# Patient Record
Sex: Female | Born: 1970 | Race: White | Hispanic: No | Marital: Single
Health system: Southern US, Community
[De-identification: ages and names within clinical notes are randomized; demographics above are authoritative.]

## PROBLEM LIST (undated history)

## (undated) DIAGNOSIS — D649 Anemia, unspecified: Secondary | ICD-10-CM

## (undated) DIAGNOSIS — Z8489 Family history of other specified conditions: Secondary | ICD-10-CM

## (undated) DIAGNOSIS — J45909 Unspecified asthma, uncomplicated: Secondary | ICD-10-CM

## (undated) DIAGNOSIS — S32810B Multiple fractures of pelvis with stable disruption of pelvic ring, initial encounter for open fracture: Secondary | ICD-10-CM

## (undated) DIAGNOSIS — I10 Essential (primary) hypertension: Secondary | ICD-10-CM

## (undated) DIAGNOSIS — K219 Gastro-esophageal reflux disease without esophagitis: Secondary | ICD-10-CM

## (undated) DIAGNOSIS — M160 Bilateral primary osteoarthritis of hip: Secondary | ICD-10-CM

## (undated) DIAGNOSIS — R0600 Dyspnea, unspecified: Secondary | ICD-10-CM

## (undated) DIAGNOSIS — F419 Anxiety disorder, unspecified: Secondary | ICD-10-CM

## (undated) DIAGNOSIS — R569 Unspecified convulsions: Secondary | ICD-10-CM

---

## 2010-01-13 ENCOUNTER — Encounter
Admission: RE | Admit: 2010-01-13 | Discharge: 2010-01-13 | Payer: Self-pay | Source: Home / Self Care | Admitting: Obstetrics and Gynecology

## 2010-11-24 ENCOUNTER — Encounter (INDEPENDENT_AMBULATORY_CARE_PROVIDER_SITE_OTHER): Payer: Self-pay | Admitting: Surgery

## 2010-11-24 DIAGNOSIS — J029 Acute pharyngitis, unspecified: Secondary | ICD-10-CM | POA: Insufficient documentation

## 2011-02-02 DIAGNOSIS — S32810B Multiple fractures of pelvis with stable disruption of pelvic ring, initial encounter for open fracture: Secondary | ICD-10-CM

## 2011-02-02 HISTORY — DX: Multiple fractures of pelvis with stable disruption of pelvic ring, initial encounter for open fracture: S32.810B

## 2012-09-01 HISTORY — PX: BREAST ENHANCEMENT SURGERY: SHX7

## 2012-10-17 ENCOUNTER — Ambulatory Visit: Payer: Self-pay | Admitting: Nurse Practitioner

## 2016-12-27 ENCOUNTER — Ambulatory Visit: Payer: PRIVATE HEALTH INSURANCE | Admitting: Neurology

## 2017-03-11 ENCOUNTER — Ambulatory Visit
Admission: RE | Admit: 2017-03-11 | Discharge: 2017-03-11 | Disposition: A | Discharge status: Home / Self Care | Payer: Self-pay | Source: Ambulatory Visit | Attending: Neurosurgery | Admitting: Neurosurgery

## 2017-03-11 ENCOUNTER — Other Ambulatory Visit: Payer: Self-pay | Admitting: Radiation Therapy

## 2017-03-11 DIAGNOSIS — C719 Malignant neoplasm of brain, unspecified: Secondary | ICD-10-CM

## 2017-03-11 DIAGNOSIS — D499 Neoplasm of unspecified behavior of unspecified site: Secondary | ICD-10-CM

## 2018-02-09 ENCOUNTER — Other Ambulatory Visit: Payer: Self-pay | Admitting: Neurosurgery

## 2018-02-09 DIAGNOSIS — D496 Neoplasm of unspecified behavior of brain: Secondary | ICD-10-CM

## 2018-02-18 ENCOUNTER — Ambulatory Visit
Admission: RE | Admit: 2018-02-18 | Discharge: 2018-02-18 | Disposition: A | Discharge status: Home / Self Care | Payer: Self-pay | Source: Ambulatory Visit | Attending: Neurosurgery | Admitting: Neurosurgery

## 2018-02-18 DIAGNOSIS — D496 Neoplasm of unspecified behavior of brain: Secondary | ICD-10-CM

## 2018-02-18 MED ORDER — GADOBENATE DIMEGLUMINE 529 MG/ML IV SOLN
15.0000 mL | Freq: Once | INTRAVENOUS | Status: AC | PRN
  Administered 2018-02-18: 15 mL via INTRAVENOUS

## 2018-02-24 ENCOUNTER — Other Ambulatory Visit: Payer: Self-pay | Admitting: Neurosurgery

## 2018-03-31 ENCOUNTER — Other Ambulatory Visit (HOSPITAL_COMMUNITY): Payer: Self-pay | Admitting: Neurosurgery

## 2018-03-31 DIAGNOSIS — D496 Neoplasm of unspecified behavior of brain: Secondary | ICD-10-CM

## 2018-04-06 ENCOUNTER — Other Ambulatory Visit (HOSPITAL_COMMUNITY): Payer: Self-pay

## 2018-04-07 ENCOUNTER — Other Ambulatory Visit (HOSPITAL_COMMUNITY): Payer: Self-pay

## 2018-04-11 NOTE — Pre-Procedure Instructions (Signed)
Alayiah Fontes [facility]  04/11/2018      [facility] [address] [place] [address] Phone: [phone] Fax: [phone]    Your procedure is scheduled on April 19, 2018.  Report to [facility] Entrance "A" at 630 AM.  Call this number if you have problems the morning of surgery:  [phone]   Remember:  Do not eat or drink after midnight.    Take these medicines the morning of surgery with A SIP OF WATER  Tylenol-if needed  7 days prior to surgery STOP taking any Aspirin (unless otherwise instructed by your surgeon), Aleve, Naproxen, Ibuprofen, Motrin, Advil, Goody's, BC's, all herbal medications, fish oil, and all vitamins     Do not wear jewelry, make-up or nail polish.  Do not wear lotions, powders, or perfumes, or deodorant.  Do not shave 48 hours prior to surgery.    Do not bring valuables to the hospital.  [facility] is not responsible for any belongings or valuables.  Contacts, dentures or bridgework may not be worn into surgery.  Leave your suitcase in the car.  After surgery it may be brought to your room.  For patients admitted to the hospital, discharge time will be determined by your treatment team.  Patients discharged the day of surgery will not be allowed to drive home.    [facility]- Preparing For Surgery  Before surgery, you can play an important role. Because skin is not sterile, your skin needs to be as free of germs as possible. You can reduce the number of germs on your skin by washing with CHG (chlorahexidine gluconate) Soap before surgery.  CHG is an antiseptic cleaner which kills germs and bonds with the skin to continue killing germs even after washing.    Oral Hygiene is also important to reduce your risk of infection.  Remember - BRUSH YOUR TEETH THE MORNING OF SURGERY WITH YOUR REGULAR TOOTHPASTE  Please do not use if you have an allergy to CHG or  antibacterial soaps. If your skin becomes reddened/irritated stop using the CHG.  Do not shave (including legs and underarms) for at least 48 hours prior to first CHG shower. It is OK to shave your face.  Please follow these instructions carefully.   1. Shower the NIGHT BEFORE SURGERY and the MORNING OF SURGERY with CHG.   2. If you chose to wash your hair, wash your hair first as usual with your normal shampoo.  3. After you shampoo, rinse your hair and body thoroughly to remove the shampoo.  4. Use CHG as you would any other liquid soap. You can apply CHG directly to the skin and wash gently with a scrungie or a clean washcloth.   5. Apply the CHG Soap to your body ONLY FROM THE NECK DOWN.  Do not use on open wounds or open sores. Avoid contact with your eyes, ears, mouth and genitals (private parts). Wash Face and genitals (private parts)  with your normal soap.  6. Wash thoroughly, paying special attention to the area where your surgery will be performed.  7. Thoroughly rinse your body with warm water from the neck down.  8. DO NOT shower/wash with your normal soap after using and rinsing off the CHG Soap.  9. Pat yourself dry with a CLEAN TOWEL.  10. Wear CLEAN PAJAMAS to bed the night before surgery,  wear comfortable clothes the morning of surgery  11. Place CLEAN SHEETS on your bed the night of your first shower and DO NOT SLEEP WITH PETS.  Day of Surgery:  Do not apply any deodorants/lotions.  Please wear clean clothes to the hospital/surgery center.   Remember to brush your teeth WITH YOUR REGULAR TOOTHPASTE.   Please read over the following fact sheets that you were given.

## 2018-04-12 ENCOUNTER — Other Ambulatory Visit: Payer: Self-pay

## 2018-04-12 ENCOUNTER — Encounter (HOSPITAL_COMMUNITY)
Admission: RE | Admit: 2018-04-12 | Discharge: 2018-04-12 | Disposition: A | Discharge status: Home / Self Care | Payer: BLUE CROSS/BLUE SHIELD | Source: Ambulatory Visit | Attending: Neurosurgery | Admitting: Neurosurgery

## 2018-04-12 ENCOUNTER — Encounter (HOSPITAL_COMMUNITY): Payer: Self-pay

## 2018-04-12 ENCOUNTER — Ambulatory Visit (HOSPITAL_COMMUNITY)
Admission: RE | Admit: 2018-04-12 | Discharge: 2018-04-12 | Disposition: A | Discharge status: Home / Self Care | Payer: BLUE CROSS/BLUE SHIELD | Source: Ambulatory Visit | Attending: Neurosurgery | Admitting: Neurosurgery

## 2018-04-12 DIAGNOSIS — D496 Neoplasm of unspecified behavior of brain: Secondary | ICD-10-CM | POA: Insufficient documentation

## 2018-04-12 HISTORY — DX: Multiple fractures of pelvis with stable disruption of pelvic ring, initial encounter for open fracture: S32.810B

## 2018-04-12 HISTORY — DX: Anxiety disorder, unspecified: F41.9

## 2018-04-12 HISTORY — DX: Unspecified asthma, uncomplicated: J45.909

## 2018-04-12 HISTORY — DX: Family history of other specified conditions: Z84.89

## 2018-04-12 HISTORY — DX: Bilateral primary osteoarthritis of hip: M16.0

## 2018-04-12 HISTORY — DX: Anemia, unspecified: D64.9

## 2018-04-12 LAB — BASIC METABOLIC PANEL
Anion gap: 9 (ref 5–15)
BUN: 8 mg/dL (ref 6–20)
CO2: 23 mmol/L (ref 22–32)
CREATININE: 0.63 mg/dL (ref 0.44–1.00)
Calcium: 9.2 mg/dL (ref 8.9–10.3)
Chloride: 106 mmol/L (ref 98–111)
GFR calc non Af Amer: >60 mL/min (ref >60)
GLUCOSE: 102 mg/dL — AB (ref 70–99)
Potassium: 3.7 mmol/L (ref 3.5–5.1)
Sodium: 138 mmol/L (ref 135–145)

## 2018-04-12 LAB — TYPE AND SCREEN
ABO/RH(D): A NEG
Antibody Screen: NEGATIVE

## 2018-04-12 LAB — CBC
HCT: 38.6 % (ref 36.0–46.0)
Hemoglobin: 12.3 g/dL (ref 12.0–15.0)
MCH: 29.6 pg (ref 26.0–34.0)
MCHC: 31.9 g/dL (ref 30.0–36.0)
MCV: 92.8 fL (ref 80.0–100.0)
NRBC: 0 % (ref 0.0–0.2)
PLATELETS: 342 10*3/uL (ref 150–400)
RBC: 4.16 MIL/uL (ref 3.87–5.11)
RDW: 12.3 % (ref 11.5–15.5)
WBC: 6.8 10*3/uL (ref 4.0–10.5)

## 2018-04-12 LAB — ABO/RH: ABO/RH(D): A NEG

## 2018-04-12 NOTE — Progress Notes (Signed)
PCP: [facility]--currently in transition between providers, unable to remember name Cardiologist: denies  EKG: n/a CXR: n/a ECHO: denies Stress Test: denies Cardiac Cath: denies  For CT scan after PAT appt today   Patient denies shortness of breath, fever, cough, and chest pain at PAT appointment.  Patient verbalized understanding of instructions provided today at the PAT appointment.  Patient asked to review instructions at home and day of surgery.

## 2018-04-14 NOTE — Anesthesia Preprocedure Evaluation (Addendum)
Anesthesia Evaluation  Patient identified by MRN, date of birth, ID band Patient awake    Reviewed: Allergy & Precautions, NPO status , Patient's Chart, lab work & pertinent test results  Airway Mallampati: II  TM Distance: >3 FB Neck ROM: Full    Dental  (+) Teeth Intact, Dental Advisory Given   Pulmonary    breath sounds clear to auscultation       Cardiovascular  Rhythm:Regular Rate:Normal     Neuro/Psych    GI/Hepatic   Endo/Other    Renal/GU      Musculoskeletal   Abdominal   Peds  Hematology   Anesthesia Other Findings   Reproductive/Obstetrics                             Anesthesia Physical Anesthesia Plan  ASA: III  Anesthesia Plan: MAC   Post-op Pain Management:    Induction: Intravenous  PONV Risk Score and Plan: Ondansetron and Dexamethasone  Airway Management Planned: Simple Face Mask and Natural Airway  Additional Equipment:   Intra-op Plan:   Post-operative Plan:   Informed Consent: I have reviewed the patients History and Physical, chart, labs and discussed the procedure including the risks, benefits and alternatives for the proposed anesthesia with the patient or authorized representative who has indicated his/her understanding and acceptance.     Dental advisory given  Plan Discussed with: CRNA and Anesthesiologist  Anesthesia Plan Comments: (Arterial line in left radial and PIV in right side please.  Anesthesia will have right side of patient per Dr Sherwood Gambler.  Henderson Cloud, CRNA  )      Anesthesia Quick Evaluation

## 2018-04-17 ENCOUNTER — Other Ambulatory Visit (HOSPITAL_COMMUNITY): Payer: Self-pay

## 2018-04-18 ENCOUNTER — Other Ambulatory Visit: Payer: Self-pay | Admitting: Neurosurgery

## 2018-04-18 NOTE — Progress Notes (Signed)
Pt contacted, pt denies domestic or international travel in the last 2 weeks. Pt denies cold, cough, flu or fever. Pt reports support person accompanying them to surgery is in good health and has not had any new symptoms of illness.   Jacqlyn Larsen, RN

## 2018-04-19 ENCOUNTER — Inpatient Hospital Stay (HOSPITAL_COMMUNITY)
Admission: RE | Admit: 2018-04-19 | Discharge: 2018-04-21 | DRG: 027 | Disposition: A | Discharge status: Home / Self Care | Payer: BLUE CROSS/BLUE SHIELD | Attending: Neurosurgery | Admitting: Neurosurgery

## 2018-04-19 ENCOUNTER — Inpatient Hospital Stay (HOSPITAL_COMMUNITY): Payer: BLUE CROSS/BLUE SHIELD | Admitting: Certified Registered Nurse Anesthetist

## 2018-04-19 ENCOUNTER — Encounter (HOSPITAL_COMMUNITY)
Admission: RE | Disposition: A | Discharge status: Home / Self Care | Payer: Self-pay | Source: Home / Self Care | Attending: Neurosurgery

## 2018-04-19 ENCOUNTER — Encounter (HOSPITAL_COMMUNITY): Payer: Self-pay | Admitting: *Deleted

## 2018-04-19 ENCOUNTER — Other Ambulatory Visit: Payer: Self-pay

## 2018-04-19 DIAGNOSIS — Y92230 Patient room in hospital as the place of occurrence of the external cause: Secondary | ICD-10-CM | POA: Diagnosis not present

## 2018-04-19 DIAGNOSIS — F419 Anxiety disorder, unspecified: Secondary | ICD-10-CM | POA: Diagnosis present

## 2018-04-19 DIAGNOSIS — T402X5A Adverse effect of other opioids, initial encounter: Secondary | ICD-10-CM | POA: Diagnosis not present

## 2018-04-19 DIAGNOSIS — C719 Malignant neoplasm of brain, unspecified: Secondary | ICD-10-CM | POA: Diagnosis present

## 2018-04-19 DIAGNOSIS — Z79899 Other long term (current) drug therapy: Secondary | ICD-10-CM

## 2018-04-19 DIAGNOSIS — Z793 Long term (current) use of hormonal contraceptives: Secondary | ICD-10-CM | POA: Diagnosis not present

## 2018-04-19 DIAGNOSIS — M1610 Unilateral primary osteoarthritis, unspecified hip: Secondary | ICD-10-CM | POA: Diagnosis present

## 2018-04-19 DIAGNOSIS — D496 Neoplasm of unspecified behavior of brain: Secondary | ICD-10-CM | POA: Diagnosis present

## 2018-04-19 HISTORY — PX: CRANIOTOMY: SHX93

## 2018-04-19 HISTORY — PX: APPLICATION OF CRANIAL NAVIGATION: SHX6578

## 2018-04-19 LAB — POCT PREGNANCY, URINE: Preg Test, Ur: NEGATIVE

## 2018-04-19 SURGERY — CRANIOTOMY TUMOR EXCISION
Anesthesia: Monitor Anesthesia Care | Site: Head | Laterality: Left

## 2018-04-19 MED ORDER — DEXAMETHASONE SODIUM PHOSPHATE 10 MG/ML IJ SOLN
INTRAMUSCULAR | Status: DC | PRN
  Administered 2018-04-19: 10 mg via INTRAVENOUS

## 2018-04-19 MED ORDER — DEXAMETHASONE SODIUM PHOSPHATE 4 MG/ML IJ SOLN
4.0000 mg | Freq: Four times a day (QID) | INTRAMUSCULAR | Status: AC
  Administered 2018-04-20 – 2018-04-21 (×4): 4 mg via INTRAVENOUS
  Filled 2018-04-19 (×4): qty 1

## 2018-04-19 MED ORDER — SODIUM CHLORIDE 0.9 % IV SOLN
INTRAVENOUS | Status: DC | PRN
  Administered 2018-04-19 (×2): via INTRAVENOUS

## 2018-04-19 MED ORDER — HYDROXYZINE HCL 50 MG PO TABS
50.0000 mg | ORAL_TABLET | ORAL | Status: DC | PRN
  Filled 2018-04-19: qty 1

## 2018-04-19 MED ORDER — NORGESTIM-ETH ESTRAD TRIPHASIC 0.18/0.215/0.25 MG-35 MCG PO TABS: 1.0000 | ORAL_TABLET | Freq: Every evening | ORAL | Status: DC

## 2018-04-19 MED ORDER — ACETAMINOPHEN 10 MG/ML IV SOLN: 1000.0000 mg | Freq: Once | INTRAVENOUS | Status: DC | PRN

## 2018-04-19 MED ORDER — METHYLENE BLUE 0.5 % INJ SOLN
INTRAVENOUS | Status: AC
  Filled 2018-04-19: qty 10

## 2018-04-19 MED ORDER — THROMBIN 5000 UNITS EX SOLR
CUTANEOUS | Status: AC
  Filled 2018-04-19: qty 5000

## 2018-04-19 MED ORDER — ONDANSETRON HCL 4 MG PO TABS: 4.0000 mg | ORAL_TABLET | ORAL | Status: DC | PRN

## 2018-04-19 MED ORDER — KETAMINE HCL 50 MG/5ML IJ SOSY
PREFILLED_SYRINGE | INTRAMUSCULAR | Status: AC
  Filled 2018-04-19: qty 5

## 2018-04-19 MED ORDER — MAGNESIUM HYDROXIDE 400 MG/5ML PO SUSP: 30.0000 mL | Freq: Every day | ORAL | Status: DC | PRN

## 2018-04-19 MED ORDER — SODIUM CHLORIDE 0.9 % IV SOLN
INTRAVENOUS | Status: DC | PRN
  Administered 2018-04-19: .05 ug/kg/min via INTRAVENOUS

## 2018-04-19 MED ORDER — CEFAZOLIN SODIUM-DEXTROSE 2-4 GM/100ML-% IV SOLN
INTRAVENOUS | Status: AC
  Filled 2018-04-19: qty 100

## 2018-04-19 MED ORDER — KETAMINE HCL 10 MG/ML IJ SOLN
INTRAMUSCULAR | Status: DC | PRN
  Administered 2018-04-19: 5 mg via INTRAVENOUS

## 2018-04-19 MED ORDER — BACITRACIN ZINC 500 UNIT/GM EX OINT
TOPICAL_OINTMENT | CUTANEOUS | Status: AC
  Filled 2018-04-19: qty 28.35

## 2018-04-19 MED ORDER — ONDANSETRON HCL 4 MG/2ML IJ SOLN: 4.0000 mg | Freq: Once | INTRAMUSCULAR | Status: DC | PRN

## 2018-04-19 MED ORDER — PROPOFOL 10 MG/ML IV BOLUS
INTRAVENOUS | Status: AC
  Filled 2018-04-19: qty 20

## 2018-04-19 MED ORDER — LIDOCAINE-EPINEPHRINE 1 %-1:100000 IJ SOLN
INTRAMUSCULAR | Status: AC
  Filled 2018-04-19: qty 3

## 2018-04-19 MED ORDER — LIDOCAINE HCL 1 % IJ SOLN
INTRAMUSCULAR | Status: DC | PRN
  Administered 2018-04-19: .4 mL

## 2018-04-19 MED ORDER — DEXMEDETOMIDINE HCL IN NACL 200 MCG/50ML IV SOLN
INTRAVENOUS | Status: AC
  Filled 2018-04-19: qty 50

## 2018-04-19 MED ORDER — BUPIVACAINE HCL (PF) 0.5 % IJ SOLN
INTRAMUSCULAR | Status: AC
  Filled 2018-04-19: qty 90

## 2018-04-19 MED ORDER — PANTOPRAZOLE SODIUM 40 MG IV SOLR
40.0000 mg | Freq: Every day | INTRAVENOUS | Status: DC
  Administered 2018-04-19 – 2018-04-20 (×2): 40 mg via INTRAVENOUS
  Filled 2018-04-19 (×2): qty 40

## 2018-04-19 MED ORDER — CEFAZOLIN SODIUM-DEXTROSE 2-4 GM/100ML-% IV SOLN
2.0000 g | INTRAVENOUS | Status: AC
  Administered 2018-04-19: 2 g via INTRAVENOUS

## 2018-04-19 MED ORDER — FLEET ENEMA 7-19 GM/118ML RE ENEM: 1.0000 | ENEMA | Freq: Once | RECTAL | Status: DC | PRN

## 2018-04-19 MED ORDER — CHLORHEXIDINE GLUCONATE CLOTH 2 % EX PADS: 6.0000 | MEDICATED_PAD | Freq: Once | CUTANEOUS | Status: DC

## 2018-04-19 MED ORDER — LIDOCAINE HCL (PF) 1 % IJ SOLN
INTRAMUSCULAR | Status: AC
  Filled 2018-04-19: qty 60

## 2018-04-19 MED ORDER — HEMOSTATIC AGENTS (NO CHARGE) OPTIME
TOPICAL | Status: DC | PRN
  Administered 2018-04-19: 1 via TOPICAL

## 2018-04-19 MED ORDER — POTASSIUM CHLORIDE IN NACL 40-0.9 MEQ/L-% IV SOLN
INTRAVENOUS | Status: DC
  Administered 2018-04-19 – 2018-04-20 (×2): 100 mL/h via INTRAVENOUS
  Filled 2018-04-19 (×2): qty 1000

## 2018-04-19 MED ORDER — ACETAMINOPHEN 10 MG/ML IV SOLN
INTRAVENOUS | Status: DC | PRN
  Administered 2018-04-19: 1000 mg via INTRAVENOUS

## 2018-04-19 MED ORDER — SODIUM CHLORIDE 0.9 % IV SOLN
INTRAVENOUS | Status: DC | PRN
  Administered 2018-04-19: 500 mL

## 2018-04-19 MED ORDER — ONDANSETRON HCL 4 MG/2ML IJ SOLN
4.0000 mg | INTRAMUSCULAR | Status: DC | PRN
  Administered 2018-04-19: 8 mg via INTRAVENOUS
  Filled 2018-04-19: qty 4

## 2018-04-19 MED ORDER — FAMOTIDINE IN NACL 20-0.9 MG/50ML-% IV SOLN
20.0000 mg | INTRAVENOUS | Status: AC
  Administered 2018-04-19: 20 mg via INTRAVENOUS
  Filled 2018-04-19: qty 50

## 2018-04-19 MED ORDER — HYDROXYZINE HCL 50 MG/ML IM SOLN
50.0000 mg | INTRAMUSCULAR | Status: DC | PRN
  Filled 2018-04-19: qty 1

## 2018-04-19 MED ORDER — DEXMEDETOMIDINE HCL IN NACL 400 MCG/100ML IV SOLN
INTRAVENOUS | Status: DC | PRN
  Administered 2018-04-19: 0.7 ug/kg/h via INTRAVENOUS

## 2018-04-19 MED ORDER — HYDROCODONE-ACETAMINOPHEN 5-325 MG PO TABS
1.0000 | ORAL_TABLET | ORAL | Status: DC | PRN
  Administered 2018-04-19 (×2): 2 via ORAL
  Administered 2018-04-20: 1 via ORAL
  Administered 2018-04-20 – 2018-04-21 (×3): 2 via ORAL
  Filled 2018-04-19: qty 1
  Filled 2018-04-19 (×5): qty 2

## 2018-04-19 MED ORDER — MORPHINE SULFATE (PF) 4 MG/ML IV SOLN
4.0000 mg | INTRAVENOUS | Status: DC | PRN
  Administered 2018-04-19: 4 mg via INTRAMUSCULAR
  Filled 2018-04-19: qty 1

## 2018-04-19 MED ORDER — 0.9 % SODIUM CHLORIDE (POUR BTL) OPTIME
TOPICAL | Status: DC | PRN
  Administered 2018-04-19 (×4): 1000 mL

## 2018-04-19 MED ORDER — PROPOFOL 10 MG/ML IV BOLUS
INTRAVENOUS | Status: DC | PRN
  Administered 2018-04-19: 40 mg via INTRAVENOUS

## 2018-04-19 MED ORDER — THROMBIN 20000 UNITS EX SOLR
CUTANEOUS | Status: DC | PRN
  Administered 2018-04-19: 20 mL via TOPICAL

## 2018-04-19 MED ORDER — PHENYLEPHRINE 40 MCG/ML (10ML) SYRINGE FOR IV PUSH (FOR BLOOD PRESSURE SUPPORT)
PREFILLED_SYRINGE | INTRAVENOUS | Status: AC
  Filled 2018-04-19: qty 10

## 2018-04-19 MED ORDER — MIDAZOLAM HCL 5 MG/5ML IJ SOLN
INTRAMUSCULAR | Status: DC | PRN
  Administered 2018-04-19 (×4): 1 mg via INTRAVENOUS

## 2018-04-19 MED ORDER — LIDOCAINE-EPINEPHRINE 1 %-1:100000 IJ SOLN
INTRAMUSCULAR | Status: DC | PRN
  Administered 2018-04-19: 20 mL

## 2018-04-19 MED ORDER — FENTANYL CITRATE (PF) 100 MCG/2ML IJ SOLN
INTRAMUSCULAR | Status: DC | PRN
  Administered 2018-04-19 (×2): 50 ug via INTRAVENOUS

## 2018-04-19 MED ORDER — LEVETIRACETAM IN NACL 500 MG/100ML IV SOLN
500.0000 mg | Freq: Two times a day (BID) | INTRAVENOUS | Status: DC
  Administered 2018-04-19 – 2018-04-21 (×4): 500 mg via INTRAVENOUS
  Filled 2018-04-19 (×5): qty 100

## 2018-04-19 MED ORDER — DEXAMETHASONE SODIUM PHOSPHATE 4 MG/ML IJ SOLN
4.0000 mg | Freq: Three times a day (TID) | INTRAMUSCULAR | Status: DC
  Administered 2018-04-21: 4 mg via INTRAVENOUS
  Filled 2018-04-19: qty 1

## 2018-04-19 MED ORDER — BACITRACIN ZINC 500 UNIT/GM EX OINT
TOPICAL_OINTMENT | CUTANEOUS | Status: DC | PRN
  Administered 2018-04-19: 1 via TOPICAL

## 2018-04-19 MED ORDER — THROMBIN 5000 UNITS EX SOLR
OROMUCOSAL | Status: DC | PRN
  Administered 2018-04-19: 5 mL via TOPICAL

## 2018-04-19 MED ORDER — MANNITOL 25 % IV SOLN
INTRAVENOUS | Status: DC | PRN
  Administered 2018-04-19 (×4): 12.5 g via INTRAVENOUS

## 2018-04-19 MED ORDER — DEXMEDETOMIDINE HCL IN NACL 400 MCG/100ML IV SOLN
0.4000 ug/kg/h | INTRAVENOUS | Status: DC
  Filled 2018-04-19: qty 100

## 2018-04-19 MED ORDER — DEXMEDETOMIDINE HCL 200 MCG/2ML IV SOLN
INTRAVENOUS | Status: DC | PRN
  Administered 2018-04-19: 40 ug via INTRAVENOUS
  Administered 2018-04-19: 20 ug via INTRAVENOUS
  Administered 2018-04-19: 12 ug via INTRAVENOUS
  Administered 2018-04-19: 20 ug via INTRAVENOUS

## 2018-04-19 MED ORDER — REMIFENTANIL HCL 2 MG IV SOLR: INTRAVENOUS | Status: DC | PRN

## 2018-04-19 MED ORDER — SODIUM CHLORIDE 0.9 % IV SOLN
750.0000 mg | INTRAVENOUS | Status: AC
  Administered 2018-04-19: 750 mg via INTRAVENOUS
  Filled 2018-04-19: qty 7.5

## 2018-04-19 MED ORDER — HYDRALAZINE HCL 20 MG/ML IJ SOLN: 5.0000 mg | INTRAMUSCULAR | Status: DC | PRN

## 2018-04-19 MED ORDER — METHYLENE BLUE 0.5 % INJ SOLN
INTRAVENOUS | Status: DC | PRN
  Administered 2018-04-19: 5 mL via SUBMUCOSAL

## 2018-04-19 MED ORDER — LABETALOL HCL 5 MG/ML IV SOLN: 5.0000 mg | INTRAVENOUS | Status: DC | PRN

## 2018-04-19 MED ORDER — SODIUM CHLORIDE 0.9 % IV SOLN
0.0500 ug/kg/min | INTRAVENOUS | Status: DC
  Filled 2018-04-19: qty 5000

## 2018-04-19 MED ORDER — MIDAZOLAM HCL 2 MG/2ML IJ SOLN
INTRAMUSCULAR | Status: AC
  Filled 2018-04-19: qty 2

## 2018-04-19 MED ORDER — DEXAMETHASONE SODIUM PHOSPHATE 10 MG/ML IJ SOLN
6.0000 mg | Freq: Four times a day (QID) | INTRAMUSCULAR | Status: AC
  Administered 2018-04-19 – 2018-04-20 (×4): 6 mg via INTRAVENOUS
  Filled 2018-04-19 (×3): qty 1

## 2018-04-19 MED ORDER — FENTANYL CITRATE (PF) 250 MCG/5ML IJ SOLN
INTRAMUSCULAR | Status: AC
  Filled 2018-04-19: qty 5

## 2018-04-19 MED ORDER — THROMBIN 20000 UNITS EX SOLR
CUTANEOUS | Status: AC
  Filled 2018-04-19: qty 20000

## 2018-04-19 MED ORDER — FENTANYL CITRATE (PF) 100 MCG/2ML IJ SOLN: 25.0000 ug | INTRAMUSCULAR | Status: DC | PRN

## 2018-04-19 MED ORDER — BUPIVACAINE HCL (PF) 0.5 % IJ SOLN
INTRAMUSCULAR | Status: DC | PRN
  Administered 2018-04-19: 20 mL

## 2018-04-19 MED ORDER — BISACODYL 10 MG RE SUPP: 10.0000 mg | Freq: Every day | RECTAL | Status: DC | PRN

## 2018-04-19 MED ORDER — ACETAMINOPHEN 10 MG/ML IV SOLN
INTRAVENOUS | Status: AC
  Filled 2018-04-19: qty 100

## 2018-04-19 MED ORDER — LIDOCAINE 2% (20 MG/ML) 5 ML SYRINGE
INTRAMUSCULAR | Status: AC
  Filled 2018-04-19: qty 5

## 2018-04-19 MED ORDER — ONDANSETRON HCL 4 MG/2ML IJ SOLN
INTRAMUSCULAR | Status: DC | PRN
  Administered 2018-04-19: 4 mg via INTRAVENOUS

## 2018-04-19 SURGICAL SUPPLY — 85 items
APPLICATOR COTTON TIP 6 STRL (MISCELLANEOUS) ×1 IMPLANT
APPLICATOR COTTON TIP 6IN STRL (MISCELLANEOUS) ×2
BAG DECANTER FOR FLEXI CONT (MISCELLANEOUS) ×2 IMPLANT
BIT DRILL WIRE PASS 1.3MM (BIT) IMPLANT
BLADE CLIPPER SURG (BLADE) ×2 IMPLANT
BLADE ULTRA TIP 2M (BLADE) IMPLANT
BNDG STRETCH 4X75 STRL LF (GAUZE/BANDAGES/DRESSINGS) IMPLANT
BUR ACORN 6.0 PRECISION (BURR) ×2 IMPLANT
BUR MATCHSTICK NEURO 3.0 LAGG (BURR) IMPLANT
BUR SPIRAL ROUTER 2.3 (BUR) ×2 IMPLANT
CANISTER SUCT 3000ML PPV (MISCELLANEOUS) ×4 IMPLANT
CARTRIDGE OIL MAESTRO DRILL (MISCELLANEOUS) ×1 IMPLANT
CLIP VESOCCLUDE MED 6/CT (CLIP) IMPLANT
CONT SPEC 4OZ CLIKSEAL STRL BL (MISCELLANEOUS) ×6 IMPLANT
COTTONBALL LRG STERILE PKG (GAUZE/BANDAGES/DRESSINGS) IMPLANT
COVER WAND RF STERILE (DRAPES) ×2 IMPLANT
DIFFUSER DRILL AIR PNEUMATIC (MISCELLANEOUS) ×2 IMPLANT
DISP BIPOLAR PROBE CORTI STIMU (MISCELLANEOUS) ×1
DRAIN SUBARACHNOID (WOUND CARE) IMPLANT
DRAPE MICROSCOPE LEICA (MISCELLANEOUS) ×2 IMPLANT
DRAPE NEUROLOGICAL W/INCISE (DRAPES) ×2 IMPLANT
DRAPE STERI IOBAN 125X83 (DRAPES) IMPLANT
DRAPE SURG 17X23 STRL (DRAPES) IMPLANT
DRAPE WARM FLUID 44X44 (DRAPE) ×2 IMPLANT
DRILL WIRE PASS 1.3MM (BIT)
DRSG ADAPTIC 3X8 NADH LF (GAUZE/BANDAGES/DRESSINGS) ×2 IMPLANT
ELECT REM PT RETURN 9FT ADLT (ELECTROSURGICAL) ×2
ELECTRODE REM PT RTRN 9FT ADLT (ELECTROSURGICAL) ×1 IMPLANT
EVACUATOR 1/8 PVC DRAIN (DRAIN) IMPLANT
EVACUATOR SILICONE 100CC (DRAIN) IMPLANT
GAUZE SPONGE 4X4 12PLY STRL (GAUZE/BANDAGES/DRESSINGS) ×2 IMPLANT
GLOVE BIO SURGEON STRL SZ7.5 (GLOVE) ×2 IMPLANT
GLOVE BIOGEL PI IND STRL 7.0 (GLOVE) ×2 IMPLANT
GLOVE BIOGEL PI IND STRL 7.5 (GLOVE) ×1 IMPLANT
GLOVE BIOGEL PI IND STRL 8 (GLOVE) ×3 IMPLANT
GLOVE BIOGEL PI INDICATOR 7.0 (GLOVE) ×2
GLOVE BIOGEL PI INDICATOR 7.5 (GLOVE) ×1
GLOVE BIOGEL PI INDICATOR 8 (GLOVE) ×3
GLOVE ECLIPSE 7.5 STRL STRAW (GLOVE) ×14 IMPLANT
GLOVE EXAM NITRILE XL STR (GLOVE) IMPLANT
GOWN STRL REUS W/ TWL LRG LVL3 (GOWN DISPOSABLE) ×1 IMPLANT
GOWN STRL REUS W/ TWL XL LVL3 (GOWN DISPOSABLE) ×1 IMPLANT
GOWN STRL REUS W/TWL 2XL LVL3 (GOWN DISPOSABLE) ×4 IMPLANT
GOWN STRL REUS W/TWL LRG LVL3 (GOWN DISPOSABLE) ×1
GOWN STRL REUS W/TWL XL LVL3 (GOWN DISPOSABLE) ×1
GRAFT DURAGEN MATRIX 2WX2L ×2 IMPLANT
HEMOSTAT POWDER KIT SURGIFOAM (HEMOSTASIS) ×2 IMPLANT
HEMOSTAT SURGICEL 2X14 (HEMOSTASIS) ×2 IMPLANT
KIT BASIN OR (CUSTOM PROCEDURE TRAY) ×2 IMPLANT
KIT TURNOVER KIT B (KITS) ×2 IMPLANT
MARKER SPHERE PSV REFLC 13MM (MARKER) ×4 IMPLANT
NEEDLE HYPO 30X.5 LL (NEEDLE) ×4 IMPLANT
NEEDLE SPNL 22GX3.5 QUINCKE BK (NEEDLE) ×2 IMPLANT
NS IRRIG 1000ML POUR BTL (IV SOLUTION) ×8 IMPLANT
OIL CARTRIDGE MAESTRO DRILL (MISCELLANEOUS) ×2
PACK CRANIOTOMY CUSTOM (CUSTOM PROCEDURE TRAY) ×2 IMPLANT
PAD ARMBOARD 7.5X6 YLW CONV (MISCELLANEOUS) ×2 IMPLANT
PATTIES SURGICAL .25X.25 (GAUZE/BANDAGES/DRESSINGS) IMPLANT
PATTIES SURGICAL .5 X.5 (GAUZE/BANDAGES/DRESSINGS) ×2 IMPLANT
PATTIES SURGICAL .5 X1 (DISPOSABLE) IMPLANT
PATTIES SURGICAL 1/4 X 3 (GAUZE/BANDAGES/DRESSINGS) ×2 IMPLANT
PATTIES SURGICAL 1X1 (DISPOSABLE) ×2 IMPLANT
PATTIES SURGICAL 3 X3 (GAUZE/BANDAGES/DRESSINGS) ×1
PATTIES SURGICAL 3X3 (GAUZE/BANDAGES/DRESSINGS) ×1 IMPLANT
PLATE 1.5  2HOLE MED NEURO (Plate) ×1 IMPLANT
PLATE 1.5 2HOLE MED NEURO (Plate) ×1 IMPLANT
PLATE 1.5 5HOLE SQUARE (Plate) ×2 IMPLANT
PLATE 1.5/0.5 13MM BURR HOLE (Plate) ×2 IMPLANT
PROBE BIPOLAR CORTI STIM NCS (MISCELLANEOUS) ×1 IMPLANT
RUBBERBAND STERILE (MISCELLANEOUS) ×4 IMPLANT
SCREW SELF DRILL HT 1.5/4MM (Screw) ×22 IMPLANT
SPONGE SURGIFOAM ABS GEL 100 (HEMOSTASIS) ×2 IMPLANT
STAPLER SKIN PROX WIDE 3.9 (STAPLE) ×4 IMPLANT
SUT NURALON 4 0 TR CR/8 (SUTURE) ×6 IMPLANT
SUT VIC AB 0 CT1 18XCR BRD8 (SUTURE) ×1 IMPLANT
SUT VIC AB 0 CT1 8-18 (SUTURE) ×1
SUT VIC AB 2-0 CP2 18 (SUTURE) ×4 IMPLANT
SYR TB 1ML 25GX5/8 (SYRINGE) ×4 IMPLANT
TAPE CLOTH SURG 4X10 WHT LF (GAUZE/BANDAGES/DRESSINGS) ×2 IMPLANT
TAPE PAPER 2X10 WHT MICROPORE (GAUZE/BANDAGES/DRESSINGS) IMPLANT
TOWEL GREEN STERILE (TOWEL DISPOSABLE) ×2 IMPLANT
TOWEL GREEN STERILE FF (TOWEL DISPOSABLE) ×2 IMPLANT
TRAY FOLEY MTR SLVR 16FR STAT (SET/KITS/TRAYS/PACK) IMPLANT
UNDERPAD 30X30 (UNDERPADS AND DIAPERS) IMPLANT
WATER STERILE IRR 1000ML POUR (IV SOLUTION) ×2 IMPLANT

## 2018-04-19 NOTE — Op Note (Signed)
04/19/2018  11:58 AM  PATIENT:  Shirley Fisher  48 y.o. 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  PROCEDURE:  Procedure(s):  Left frontoparietal awake (no intubation) craniotomy with intraoperative stereotactic cranial navigation with BrainLab and with intraoperative electrophysiologic monitoring including electrocorticography, sensory mapping with phase reversal, EMG from right-sided facial, upper extremity, and lower extremity musculature, and motor mapping  SURGEON: Jovita Gamma, MD  ASSISTANTS: Emelda Brothers, MD  ANESTHESIA:   general  EBL:  Total I/O In: 1000 [I.V.:1000] Out: 1325 [Urine:1125; Blood:200]  BLOOD ADMINISTERED:none  COUNT:  Correct per nursing staff  SPECIMEN:  Source of Specimen:  Left posterior middle frontal gyrus  DICTATION:  Prior to surgery stereotactic protocol CT and MRI of the brain were performed.  The studies were merged, and preoperative stereotactic cranial navigation planning was performed.  Patient was brought the operating room and deep sedation and light anesthesia was administered by the anesthesia service.  The patient was not intubated, but oxygen was provided by both nasal cannula and facemask, and a nasal trumpet was placed.  The scalp was shaved with electric clippers.  The supraorbital, preauricular, postauricular, and occipital nerves were blocked with lidocaine with epinephrine and Marcaine.  The 3 pin Mayfield head holder was positioned, and the pin sites were infiltrated with lidocaine with epinephrine and Marcaine, and then the 3 pin Mayfield head holder placed.  A roll was placed beneath the left shoulder and the patient was gently turned towards the right.  The patient was registered to the BrainLab cranial stereotactic navigation system, and the tumor localized, and the surgical approach incision planned.  The  intraoperative electrophysiological monitoring leads were placed.  The scalp was then prepped with Betadine soap and solution and draped in a sterile fashion.  Using the stereotactic navigation system the tumor was again localized and the incision planned.  The line of the incision was infiltrated lidocaine with epinephrine and Marcaine.  A parasagittal incision was made essentially from the left frontal boss to the left parietal boss.  Raney clips were applied to the scalp edges to maintain hemostasis.  We again used the stereotactic navigation system to localize the tumor and planned our craniotomy.  A single low left parietal burr hole was made, and the dura dissected from the overlying skull.  The craniotome was then used to turn a bone flap.  The dura was tacked up around the margins of the craniotomy with 4-0 Nurolon sutures.  We then infiltrated the dura, along the planned incision line, with 1% Xylocaine local anesthetic without epinephrine.  The dura was then opened in a U-shaped fashion hinge towards the midline.  The brain was moderately swollen and 1 specific gyrus appeared prominently swollen.  Mannitol was administered by the anesthesia service, and we proceeded with the electrophysiologic evaluation, to localize the precentral gyrus, central sulcus, and postcentral gyrus.  We stimulated over the planned cortical incision and no motor activity was noted on the right side.  We then coagulated the pial surface, and entered into the swollen gyrus.  Dissection was carried down into the gyrus so that we could perform a sub-pial resection.  A large specimen of cortical and subcortical tissue was obtained and ultimately sent to pathology for placement in formalin for permanent examination.  Points of bleeding were controlled with bipolar cautery as well as Gelfoam with thrombin.  We then continued the resection of tumor exposing the  adjacent cortical pial surfaces.  The sedation and anesthesia were lightened,  and the patient was examined and was speaking, and noted to be following commands with both the right hand and right foot.  The sedation and anesthesia was then restored.  We felt that a gross total resection of tumor was achieved both by direct visualization as well as with the intraoperative stereotaxis.  Hemostasis was established, and the wound was irrigated extensively with warm saline solution.  We then proceeded with closure.  The dura was approximated interrupted 4-0 Nurolon sutures.  Pledgets of DuraGen were placed over the line of the dural incisions.  A 4-0 Nurolon Poppen stitch was placed, and the bone flap was secured with multiple Lorenz cranial plates and screws, and then the Poppen stitch was secured.  The scalp was closed in multiple layers.  The galea was closed with interrupted inverted of 0 and 2-0 undyed Vicryl sutures.  Skin is were approximated with surgical staples.  The wound was dressed with Adaptic, sterile gauze, and Hypafix.  Following surgery the sedation and anesthesia were discontinued.  The patient was examined and noted to be following commands with the right side of her face, her right hand and her right foot.  She was then transferred to the recovery room for further care.  PLAN OF CARE: Admit to inpatient   PATIENT DISPOSITION:  PACU - hemodynamically stable.   Delay start of Pharmacological VTE agent (>24hrs) due to surgical blood loss or risk of bleeding:  yes

## 2018-04-19 NOTE — Anesthesia Postprocedure Evaluation (Signed)
Anesthesia Post Note  Patient: [name]  Procedure(s) Performed: Craniotomy - left - Frontal - Parietal -- AWAKE with Brain Lab (Left Head) APPLICATION OF CRANIAL NAVIGATION (Left Head)     Patient location during evaluation: PACU Anesthesia Type: MAC Level of consciousness: awake and alert Pain management: pain level controlled Vital Signs Assessment: post-procedure vital signs reviewed and stable Respiratory status: spontaneous breathing, nonlabored ventilation, respiratory function stable and patient connected to nasal cannula oxygen Cardiovascular status: stable and blood pressure returned to baseline Postop Assessment: no apparent nausea or vomiting Anesthetic complications: no    Last Vitals:  Vitals:   04/19/18 1400 04/19/18 1500  BP: (!) 138/92 123/86  Pulse: 81 71  Resp: 17 15  Temp:    SpO2: 95% 94%    Last Pain:  Vitals:   04/19/18 1330  TempSrc: Oral                 Almeda Ezra COKER

## 2018-04-19 NOTE — Progress Notes (Signed)
Subjective: Patient resting in bed, mild incisional pain.  Mild nausea after taking hydrocodone, given Zofran.  Asking to eat, which she feels will relieve her nausea.  Otherwise without complaints.  Objective: Vital signs in last 24 hours: Vitals:   04/19/18 1330 04/19/18 1400 04/19/18 1500 04/19/18 1600  BP: (!) 130/92 (!) 138/92 123/86 (!) 133/91  Pulse: 73 81 71 89  Resp: 14 17 15 15   Temp: 97.7 F (36.5 C)   97.6 F (36.4 C)  TempSrc: Oral   Oral  SpO2: 96% 95% 94% 96%  Weight:      Height:        Intake/Output from previous day: No intake/output data recorded. Intake/Output this shift: Total I/O In: 1173.4 [I.V.:1123.4; Other:50] Out: 2125 [Urine:1925; Blood:200]  Physical Exam: Awake and alert, oriented.  Pupils equal round reactive to light.  EOMI.  Mild flattening of right nasolabial fold (unchanged as compared to preop).  Strength 5/5 in upper and lower extremities.  No drift of upper extremities.  Dressing clean and dry.  Assessment/Plan: Doing well following surgery.  Will DC Foley and A-line.  Will advance to regular diet.  Will begin out of bed to chair this evening, and advance to ambulation in the ICU tomorrow.  Hosie Spangle, MD 04/19/2018, 5:31 PM

## 2018-04-19 NOTE — Progress Notes (Signed)
Subjective: Patient resting comfortably in PACU, without complaints.  Objective: Vital signs in last 24 hours: Vitals:   04/19/18 1211 04/19/18 1226 04/19/18 1241 04/19/18 1256  BP: (!) 144/72 138/78 (!) 146/78 (!) 141/79  Pulse: 70 66 69 80  Resp: 16 13 13 17   Temp: (!) 97.5 F (36.4 C)     TempSrc:      SpO2: 99% 97% 97% 97%  Weight:      Height:        Intake/Output from previous day: No intake/output data recorded. Intake/Output this shift: Total I/O In: 1000 [I.V.:1000] Out: 1325 [Urine:1125; Blood:200]  Physical Exam: Drowsy, but easily aroused.  Opens eyes to voice.  Following commands.  Speech fluent.  Pupils equal, round, reactive to light.  EOMI.  Flattening of right nasolabial fold unchanged.  5/5 strength in upper and lower extremities bilaterally.  Dressing clean and dry.  Assessment/Plan: Doing well following surgery.  Patient is a transfer from PACU to ICU for continued monitoring.  I have spoken with her significant other following surgery regarding intraoperative findings and patient's condition.  Hosie Spangle, MD 04/19/2018, 1:02 PM

## 2018-04-19 NOTE — Progress Notes (Signed)
Patients foley and ART line discontinued. Patient transferred from bed to chair with no difficulties. Will continue to monitor. Lianne Bushy RN BSN.
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
## 2018-04-19 NOTE — Transfer of Care (Signed)
Immediate Anesthesia Transfer of Care Note  Patient: Shirley Fisher  Procedure(s) Performed: Craniotomy - left - Frontal - Parietal -- AWAKE with Brain Lab (Left Head) APPLICATION OF CRANIAL NAVIGATION (Left Head)  Patient Location: PACU  Anesthesia Type:MAC  Level of Consciousness: awake, alert  and oriented  Airway & Oxygen Therapy: Patient connected to face mask oxygen  Post-op Assessment: Post -op Vital signs reviewed and stable  Post vital signs: stable  Last Vitals:  Vitals Value Taken Time  BP 144/72 04/19/2018 12:11 PM  Temp    Pulse 70 04/19/2018 12:11 PM  Resp 16 04/19/2018 12:11 PM  SpO2 99 % 04/19/2018 12:11 PM  Vitals shown include unvalidated device data.  Last Pain:  Vitals:   04/19/18 0653  TempSrc: Oral         Complications: No apparent anesthesia complications

## 2018-04-20 ENCOUNTER — Other Ambulatory Visit: Payer: Self-pay | Admitting: Radiation Therapy

## 2018-04-20 ENCOUNTER — Inpatient Hospital Stay (HOSPITAL_COMMUNITY): Payer: BLUE CROSS/BLUE SHIELD

## 2018-04-20 LAB — CBC WITH DIFFERENTIAL/PLATELET
Abs Immature Granulocytes: 0.13 10*3/uL — ABNORMAL HIGH (ref 0.00–0.07)
Basophils Absolute: 0 10*3/uL (ref 0.0–0.1)
Basophils Relative: 0 %
Eosinophils Absolute: 0.1 10*3/uL (ref 0.0–0.5)
Eosinophils Relative: 0 %
HCT: 35.3 % — ABNORMAL LOW (ref 36.0–46.0)
Hemoglobin: 11.8 g/dL — ABNORMAL LOW (ref 12.0–15.0)
Immature Granulocytes: 1 %
Lymphocytes Relative: 4 %
Lymphs Abs: 0.9 10*3/uL (ref 0.7–4.0)
MCH: 30.7 pg (ref 26.0–34.0)
MCHC: 33.4 g/dL (ref 30.0–36.0)
MCV: 91.9 fL (ref 80.0–100.0)
Monocytes Absolute: 1.1 10*3/uL — ABNORMAL HIGH (ref 0.1–1.0)
Monocytes Relative: 5 %
Neutro Abs: 20.2 10*3/uL — ABNORMAL HIGH (ref 1.7–7.7)
Neutrophils Relative %: 90 %
Platelets: 331 10*3/uL (ref 150–400)
RBC: 3.84 MIL/uL — ABNORMAL LOW (ref 3.87–5.11)
RDW: 12.6 % (ref 11.5–15.5)
WBC: 22.5 10*3/uL — ABNORMAL HIGH (ref 4.0–10.5)
nRBC: 0 % (ref 0.0–0.2)

## 2018-04-20 LAB — BASIC METABOLIC PANEL
Anion gap: 9 (ref 5–15)
BUN: 8 mg/dL (ref 6–20)
CO2: 21 mmol/L — ABNORMAL LOW (ref 22–32)
Calcium: 8.6 mg/dL — ABNORMAL LOW (ref 8.9–10.3)
Chloride: 107 mmol/L (ref 98–111)
Creatinine, Ser: 0.79 mg/dL (ref 0.44–1.00)
GFR calc Af Amer: >60 mL/min (ref >60)
GFR calc non Af Amer: >60 mL/min (ref >60)
Glucose, Bld: 148 mg/dL — ABNORMAL HIGH (ref 70–99)
Potassium: 3.4 mmol/L — ABNORMAL LOW (ref 3.5–5.1)
Sodium: 137 mmol/L (ref 135–145)

## 2018-04-20 MED ORDER — GADOBUTROL 1 MMOL/ML IV SOLN
7.0000 mL | Freq: Once | INTRAVENOUS | Status: AC | PRN
  Administered 2018-04-20: 7 mL via INTRAVENOUS

## 2018-04-20 NOTE — Progress Notes (Signed)
Subjective: Patient resting in bed comfortably, has been out of bed to chair.  Ate dinner last night.  Denies significant headache, nausea, vomiting, or other difficulties.  Incisional discomfort controlled with hydrocodone.  Continues on tapering course of Decadron.  Underwent postoperative MRI this morning which shows subtotal tumor resection.  Pathology pending.  Objective: Vital signs in last 24 hours: Vitals:   04/20/18 0400 04/20/18 0500 04/20/18 0530 04/20/18 0600  BP: 122/74 118/70  (!) 146/90  Pulse: 100 (!) 109 87 (!) 110  Resp: (!) 24 16 15    Temp: 98.2 F (36.8 C)     TempSrc: Oral     SpO2: 95% 99% 97%   Weight:      Height:        Intake/Output from previous day: 03/18 0701 - 03/19 0700 In: 1457.1 [I.V.:1307.1; IV Piggyback:100] Out: 2525 [Urine:2325; Blood:200] Intake/Output this shift: No intake/output data recorded.  Physical Exam: Dressing clean and dry.  Awake and alert, fully oriented.  Speech fluent.  Good comprehension.  Pupils equal, round, reactive to light.  EOMI.  Right nasolabial fold flattening without change.  No drift of upper extremities.  Iliopsoas 5/5 bilaterally.  CBC Recent Labs    04/20/18 0707  WBC 22.5*  HGB 11.8*  HCT 35.3*  PLT 331    Studies/Results: Mr Jeri Cos Wo Contrast  Result Date: 04/20/2018 CLINICAL DATA:  Continued surveillance of neoplasm. Status post awake craniotomy. EXAM: MRI HEAD WITHOUT AND WITH CONTRAST TECHNIQUE: Multiplanar, multiecho pulse sequences of the brain and surrounding structures were obtained without and with intravenous contrast. CONTRAST:  Gadavist 7 mL. COMPARISON:  04/12/2018 CT.  02/18/2018 MR. FINDINGS: Brain: Only slight restricted diffusion at the margins of the tumor resection, most notable deeply. Significant debulking of tumor, with expected layering blood products in the tumor bed. There is less surrounding mass effect of the lesion on the surrounding brain, reflecting decreased tumor volume. No  extra-axial collections. No significant postcontrast enhancement of the intracranial contents except for the dura, which is postoperative. Vascular: Normal flow voids. Skull and upper cervical spine: Normal marrow signal. Sinuses/Orbits: Negative. Other: None. IMPRESSION: Satisfactory craniotomy appearance for biopsy of a LEFT middle frontal gyrus lesion, see discussion above. Electronically Signed   By: Staci Righter M.D.   On: 04/20/2018 07:18    Assessment/Plan: Continues to do well neurologically.  Will await pathology before making further clinical recommendations, however in the interim will progress through recovery.  We will begin to ambulate in the halls.  Hosie Spangle, MD 04/20/2018, 7:40 AM

## 2018-04-21 ENCOUNTER — Encounter (HOSPITAL_COMMUNITY): Payer: Self-pay | Admitting: Neurosurgery

## 2018-04-21 MED ORDER — DEXAMETHASONE 4 MG PO TABS: 4.0000 mg | ORAL_TABLET | Freq: Three times a day (TID) | ORAL | 0 refills | Status: DC

## 2018-04-21 MED ORDER — LEVETIRACETAM ER 500 MG PO TB24: 500.0000 mg | ORAL_TABLET | Freq: Two times a day (BID) | ORAL | 5 refills | Status: DC

## 2018-04-21 MED ORDER — HYDROCODONE-ACETAMINOPHEN 5-325 MG PO TABS: 1.0000 | ORAL_TABLET | ORAL | 0 refills | Status: DC | PRN

## 2018-04-21 MED ORDER — PANTOPRAZOLE SODIUM 40 MG PO PACK: 40.0000 mg | PACK | Freq: Every day | ORAL | Status: DC

## 2018-04-21 MED ORDER — LEVETIRACETAM ER 500 MG PO TB24: 500.0000 mg | ORAL_TABLET | Freq: Two times a day (BID) | ORAL | Status: DC

## 2018-04-21 NOTE — Discharge Summary (Signed)
Physician Discharge Summary  Patient ID: Shirley Fisher MRN: [id number] DOB/AGE: 05/16/1970 48 y.o.  Admit date: 04/19/2018 Discharge date: 04/21/2018  Admission Diagnoses:  Brain tumor     Discharge Diagnoses:  Brain tumor Active Problems:   Brain tumor [facility])   Discharged Condition: good  Hospital Course: Patient was admitted, underwent a left frontoparietal awake craniotomy with intraoperative stereotaxis and electrophysiologic monitoring for tumor resection.  Patient has done well following surgery.  She still has flattening of the right nasolabial fold which is been present at least for several years, but is otherwise neurologically intact.  She has no weakness on the right side.  Her incision is healing nicely.  There is no erythema, ecchymosis, swelling, or drainage.  She is up and ambulating actively.  She is eating well and voiding well.  We have tapered her dexamethasone, and will continue with dexamethasone taper post discharge.  She was also started on Keppra at the time of surgery, and we will continue her on that for the time being.  She has been using a limited amount of hydrocodone for incisional discomfort.  She is scheduled follow-up with me in 10 days for staple removal.  We have given her instructions regarding wound care and activities following discharge.  The pathology of the intraoperative specimen remains pending at this time.  Discharge Exam: Blood pressure 130/80, pulse 60, temperature 98 F (36.7 C), temperature source Oral, resp. rate 13, height 5\' 9"  (1.753 m), weight 76.8 kg, SpO2 95 %.  Disposition: Discharge disposition: 01-Home or Self Care       Discharge Instructions    Discharge wound care:   Complete by:  As directed    Leave the wound open to air. Shower daily with the wound uncovered. Water and soapy water can run over the incision area 5 days after surgery. Do not wash directly on the incision for 2 weeks.   Discharge wound care:   Complete  by:  As directed    Leave the wound open to air. Shower daily with the wound uncovered.  Beginning 5 days after surgery, water and soapy water can run over the incision area. Do not wash directly on the incision for 2 weeks.   Driving Restrictions   Complete by:  As directed    No driving until cleared by Dr. Sherwood Gambler. May ride in the car locally now.   Driving Restrictions   Complete by:  As directed    No driving until cleared by Dr. Sherwood Gambler. May ride in the car locally now.   Other Restrictions   Complete by:  As directed    Walk gradually increasing distances out in the fresh air at least twice a day. Walking additional 6 times inside the house, gradually increasing distances, daily. No bending, lifting, or twisting. Perform activities between shoulder and waist height (that is at counter height when standing or table height when sitting).   Other Restrictions   Complete by:  As directed    Walk out in the fresh air at least 3-4 times per day.  Walking up to half a mile to 1 mile with each walk.  Avoid heavy and strenuous activities.     Allergies as of 04/21/2018   No Known Allergies     Medication List    TAKE these medications   acetaminophen 500 MG tablet Commonly known as:  TYLENOL Take 500-2,000 mg by mouth 2 (two) times daily as needed for moderate pain or headache.   dexamethasone 4  MG tablet Commonly known as:  DECADRON Take 1 tablet (4 mg total) by mouth 3 (three) times daily. Taper per D. 's instruction.   HYDROcodone-acetaminophen 5-325 MG tablet Commonly known as:  NORCO/VICODIN Take 1-2 tablets by mouth every 4 (four) hours as needed (pain).   levETIRAcetam 500 MG 24 hr tablet Commonly known as:  KEPPRA XR Take 1 tablet (500 mg total) by mouth 2 (two) times daily.   Tri-Sprintec 0.18/0.215/0.25 MG-35 MCG tablet Generic drug:  Norgestimate-Ethinyl Estradiol Triphasic Take 1 tablet by mouth every evening.            Discharge Care Instructions   (From admission, onward)         Start     Ordered   04/21/18 0000  Discharge wound care:    Comments:  Leave the wound open to air. Shower daily with the wound uncovered. Water and soapy water can run over the incision area 5 days after surgery. Do not wash directly on the incision for 2 weeks.   04/21/18 1306   04/21/18 0000  Discharge wound care:    Comments:  Leave the wound open to air. Shower daily with the wound uncovered.  Beginning 5 days after surgery, water and soapy water can run over the incision area. Do not wash directly on the incision for 2 weeks.   04/21/18 1315           Signed: Hosie Spangle 04/21/2018, 1:15 PM

## 2018-04-21 NOTE — Progress Notes (Signed)
Discharge orders received, pt for discharge home today, IV D/C with dressing CDI to L temporal head.  D/C instructions and Rx given with verbalized understanding.  Family at bedside to assist pt with discharge. Staff brought pt downstairs via wheelchair.  

## 2018-04-26 ENCOUNTER — Inpatient Hospital Stay: Payer: BLUE CROSS/BLUE SHIELD | Attending: Internal Medicine

## 2018-05-09 ENCOUNTER — Encounter (HOSPITAL_COMMUNITY): Payer: Self-pay

## 2018-05-25 ENCOUNTER — Other Ambulatory Visit: Payer: Self-pay

## 2018-05-25 ENCOUNTER — Inpatient Hospital Stay: Payer: BLUE CROSS/BLUE SHIELD | Attending: Internal Medicine | Admitting: Internal Medicine

## 2018-05-25 ENCOUNTER — Telehealth: Payer: Self-pay | Admitting: Internal Medicine

## 2018-05-25 VITALS — BP 141/95 | HR 102 | Temp 98.1°F | Resp 18 | Ht 69.0 in | Wt 169.5 lb

## 2018-05-25 DIAGNOSIS — C711 Malignant neoplasm of frontal lobe: Secondary | ICD-10-CM

## 2018-05-25 DIAGNOSIS — C719 Malignant neoplasm of brain, unspecified: Secondary | ICD-10-CM

## 2018-05-25 DIAGNOSIS — Z79899 Other long term (current) drug therapy: Secondary | ICD-10-CM

## 2018-05-25 NOTE — Telephone Encounter (Signed)
Scheduled appt per 4/23 los. °
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
## 2018-07-18 ENCOUNTER — Other Ambulatory Visit: Payer: Self-pay | Admitting: Radiation Therapy

## 2018-07-24 ENCOUNTER — Inpatient Hospital Stay: Payer: BC Managed Care – PPO | Attending: Internal Medicine | Admitting: Internal Medicine

## 2018-07-24 ENCOUNTER — Other Ambulatory Visit: Payer: Self-pay

## 2018-07-24 ENCOUNTER — Ambulatory Visit
Admission: RE | Admit: 2018-07-24 | Discharge: 2018-07-24 | Disposition: A | Discharge status: Home / Self Care | Payer: BC Managed Care – PPO | Source: Ambulatory Visit | Attending: Internal Medicine | Admitting: Internal Medicine

## 2018-07-24 ENCOUNTER — Ambulatory Visit: Payer: Self-pay | Admitting: Internal Medicine

## 2018-07-24 ENCOUNTER — Telehealth: Payer: Self-pay | Admitting: Internal Medicine

## 2018-07-24 VITALS — BP 138/87 | HR 79 | Temp 98.6°F | Resp 20 | Ht 69.0 in | Wt 170.0 lb

## 2018-07-24 DIAGNOSIS — C719 Malignant neoplasm of brain, unspecified: Secondary | ICD-10-CM

## 2018-07-24 DIAGNOSIS — M160 Bilateral primary osteoarthritis of hip: Secondary | ICD-10-CM | POA: Diagnosis not present

## 2018-07-24 DIAGNOSIS — R21 Rash and other nonspecific skin eruption: Secondary | ICD-10-CM | POA: Insufficient documentation

## 2018-07-24 DIAGNOSIS — C711 Malignant neoplasm of frontal lobe: Secondary | ICD-10-CM | POA: Insufficient documentation

## 2018-07-24 DIAGNOSIS — Z79899 Other long term (current) drug therapy: Secondary | ICD-10-CM | POA: Diagnosis not present

## 2018-07-24 MED ORDER — LAMOTRIGINE 100 MG PO TABS: 100.0000 mg | ORAL_TABLET | Freq: Every day | ORAL | 3 refills | Status: DC

## 2018-07-24 MED ORDER — GADOBENATE DIMEGLUMINE 529 MG/ML IV SOLN
15.0000 mL | Freq: Once | INTRAVENOUS | Status: AC | PRN
  Administered 2018-07-24: 15 mL via INTRAVENOUS

## 2018-07-24 MED ORDER — LAMOTRIGINE 25 MG PO TABS: 25.0000 mg | ORAL_TABLET | Freq: Every day | ORAL | 0 refills | Status: DC
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
## 2018-07-24 NOTE — Telephone Encounter (Signed)
Scheduled appt per 6/22 los. Spoke with patient and she is aware of her appt date and time. °

## 2018-07-26 ENCOUNTER — Inpatient Hospital Stay: Payer: BC Managed Care – PPO

## 2018-08-12 ENCOUNTER — Other Ambulatory Visit: Payer: Self-pay | Admitting: Internal Medicine

## 2018-08-15 ENCOUNTER — Encounter: Payer: Self-pay | Admitting: Internal Medicine

## 2018-08-15 ENCOUNTER — Telehealth: Payer: Self-pay | Admitting: *Deleted

## 2018-08-15 NOTE — Telephone Encounter (Signed)
Received mychart message from patient.  Per Dr Mickeal Skinner the symptoms she is experiencing are not related to her medication Lamotrigine.   He wishes that she would continue the medication and more importantly she needs to contact her local PCP since she lives in [place] and be tested for COVID 19.  Her symptoms are suspicious for COVID.    Relayed information to patient.  She will reach out to her PCP and contact us if she has any issues with getting tested.

## 2018-09-04 DIAGNOSIS — R29810 Facial weakness: Secondary | ICD-10-CM | POA: Diagnosis not present

## 2018-09-04 DIAGNOSIS — D496 Neoplasm of unspecified behavior of brain: Secondary | ICD-10-CM | POA: Diagnosis not present

## 2018-09-29 DIAGNOSIS — Z716 Tobacco abuse counseling: Secondary | ICD-10-CM | POA: Diagnosis not present

## 2018-09-29 DIAGNOSIS — C712 Malignant neoplasm of temporal lobe: Secondary | ICD-10-CM | POA: Diagnosis not present

## 2018-09-29 DIAGNOSIS — I10 Essential (primary) hypertension: Secondary | ICD-10-CM | POA: Diagnosis not present

## 2018-09-29 DIAGNOSIS — Z1389 Encounter for screening for other disorder: Secondary | ICD-10-CM | POA: Diagnosis not present

## 2018-10-02 DIAGNOSIS — R3989 Other symptoms and signs involving the genitourinary system: Secondary | ICD-10-CM | POA: Diagnosis not present

## 2018-10-02 DIAGNOSIS — N3000 Acute cystitis without hematuria: Secondary | ICD-10-CM | POA: Diagnosis not present

## 2018-10-08 DIAGNOSIS — R569 Unspecified convulsions: Secondary | ICD-10-CM | POA: Diagnosis not present

## 2018-10-08 DIAGNOSIS — R42 Dizziness and giddiness: Secondary | ICD-10-CM | POA: Diagnosis not present

## 2018-10-08 DIAGNOSIS — G40909 Epilepsy, unspecified, not intractable, without status epilepticus: Secondary | ICD-10-CM | POA: Diagnosis not present

## 2018-10-08 DIAGNOSIS — R000 Tachycardia, unspecified: Secondary | ICD-10-CM | POA: Diagnosis not present

## 2018-10-08 DIAGNOSIS — G9389 Other specified disorders of brain: Secondary | ICD-10-CM | POA: Diagnosis not present

## 2018-10-10 ENCOUNTER — Telehealth: Payer: Self-pay | Admitting: Internal Medicine

## 2018-10-10 ENCOUNTER — Telehealth: Payer: Self-pay | Admitting: *Deleted

## 2018-10-10 NOTE — Telephone Encounter (Signed)
"  [name] [phone]).  I went to ED Sunday because of seizure.  I was told I need to see Dr. Mickeal Skinner, my neuro-oncologist"  Verbal order received and read back from Dr. Mickeal Skinner for scheduling to schedule 30-min F/U visit Thursday morning without lab.  Order given to Diona Foley to expect call from scheduling to set up a Thursday morning appointment.  Denies further needs or questions.

## 2018-10-10 NOTE — Telephone Encounter (Signed)
Scheduled appt per 9/8 sch message =- pt aware of appt date and time

## 2018-10-11 ENCOUNTER — Encounter (HOSPITAL_COMMUNITY): Payer: Self-pay | Admitting: Emergency Medicine

## 2018-10-11 ENCOUNTER — Emergency Department (HOSPITAL_COMMUNITY)
Admission: EM | Admit: 2018-10-11 | Discharge: 2018-10-11 | Discharge status: Against Medical Advice | Payer: BC Managed Care – PPO | Attending: Emergency Medicine | Admitting: Emergency Medicine

## 2018-10-11 ENCOUNTER — Other Ambulatory Visit: Payer: Self-pay

## 2018-10-11 DIAGNOSIS — Z5321 Procedure and treatment not carried out due to patient leaving prior to being seen by health care provider: Secondary | ICD-10-CM | POA: Diagnosis not present

## 2018-10-11 DIAGNOSIS — R569 Unspecified convulsions: Secondary | ICD-10-CM | POA: Insufficient documentation

## 2018-10-11 HISTORY — DX: Unspecified convulsions: R56.9

## 2018-10-11 LAB — CBC WITH DIFFERENTIAL/PLATELET
Abs Immature Granulocytes: 0.03 10*3/uL (ref 0.00–0.07)
Basophils Absolute: 0 10*3/uL (ref 0.0–0.1)
Basophils Relative: 1 %
Eosinophils Absolute: 0.1 10*3/uL (ref 0.0–0.5)
Eosinophils Relative: 1 %
HCT: 38.6 % (ref 36.0–46.0)
Hemoglobin: 12.6 g/dL (ref 12.0–15.0)
Immature Granulocytes: 1 %
Lymphocytes Relative: 26 %
Lymphs Abs: 1.6 10*3/uL (ref 0.7–4.0)
MCH: 30.4 pg (ref 26.0–34.0)
MCHC: 32.6 g/dL (ref 30.0–36.0)
MCV: 93 fL (ref 80.0–100.0)
Monocytes Absolute: 0.7 10*3/uL (ref 0.1–1.0)
Monocytes Relative: 12 %
Neutro Abs: 3.6 10*3/uL (ref 1.7–7.7)
Neutrophils Relative %: 59 %
Platelets: 408 10*3/uL — ABNORMAL HIGH (ref 150–400)
RBC: 4.15 MIL/uL (ref 3.87–5.11)
RDW: 12.2 % (ref 11.5–15.5)
WBC: 6 10*3/uL (ref 4.0–10.5)
nRBC: 0 % (ref 0.0–0.2)

## 2018-10-11 LAB — BASIC METABOLIC PANEL
Anion gap: 11 (ref 5–15)
BUN: 10 mg/dL (ref 6–20)
CO2: 24 mmol/L (ref 22–32)
Calcium: 9.3 mg/dL (ref 8.9–10.3)
Chloride: 103 mmol/L (ref 98–111)
Creatinine, Ser: 0.62 mg/dL (ref 0.44–1.00)
GFR calc Af Amer: >60 mL/min (ref >60)
GFR calc non Af Amer: >60 mL/min (ref >60)
Glucose, Bld: 109 mg/dL — ABNORMAL HIGH (ref 70–99)
Potassium: 3.6 mmol/L (ref 3.5–5.1)
Sodium: 138 mmol/L (ref 135–145)

## 2018-10-11 NOTE — ED Notes (Addendum)
Pt handed back labels and reported she is leaving.
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
## 2018-10-12 ENCOUNTER — Inpatient Hospital Stay: Payer: BC Managed Care – PPO | Attending: Internal Medicine | Admitting: Internal Medicine

## 2018-10-12 ENCOUNTER — Other Ambulatory Visit: Payer: Self-pay

## 2018-10-12 VITALS — BP 134/83 | HR 99 | Temp 99.1°F | Resp 18 | Ht 69.0 in | Wt 166.3 lb

## 2018-10-12 DIAGNOSIS — G40909 Epilepsy, unspecified, not intractable, without status epilepticus: Secondary | ICD-10-CM | POA: Insufficient documentation

## 2018-10-12 DIAGNOSIS — R569 Unspecified convulsions: Secondary | ICD-10-CM | POA: Diagnosis not present

## 2018-10-12 DIAGNOSIS — C711 Malignant neoplasm of frontal lobe: Secondary | ICD-10-CM | POA: Diagnosis not present

## 2018-10-12 DIAGNOSIS — Z79899 Other long term (current) drug therapy: Secondary | ICD-10-CM | POA: Diagnosis not present

## 2018-10-12 DIAGNOSIS — R200 Anesthesia of skin: Secondary | ICD-10-CM | POA: Diagnosis not present

## 2018-10-12 DIAGNOSIS — C719 Malignant neoplasm of brain, unspecified: Secondary | ICD-10-CM | POA: Diagnosis not present

## 2018-10-12 MED ORDER — LORAZEPAM 1 MG PO TABS: 1.0000 mg | ORAL_TABLET | Freq: Three times a day (TID) | ORAL | 0 refills | Status: DC | PRN

## 2018-10-12 MED ORDER — LAMOTRIGINE 100 MG PO TABS: 100.0000 mg | ORAL_TABLET | Freq: Two times a day (BID) | ORAL | 3 refills | Status: DC
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
## 2018-10-13 ENCOUNTER — Telehealth: Payer: Self-pay | Admitting: Internal Medicine

## 2018-10-13 NOTE — Telephone Encounter (Signed)
No los per 9/10. °

## 2018-10-23 ENCOUNTER — Other Ambulatory Visit: Payer: Self-pay

## 2018-10-23 ENCOUNTER — Ambulatory Visit (HOSPITAL_COMMUNITY)
Admission: RE | Admit: 2018-10-23 | Discharge: 2018-10-23 | Disposition: A | Discharge status: Home / Self Care | Payer: BC Managed Care – PPO | Source: Ambulatory Visit | Attending: Internal Medicine | Admitting: Internal Medicine

## 2018-10-23 ENCOUNTER — Inpatient Hospital Stay (HOSPITAL_BASED_OUTPATIENT_CLINIC_OR_DEPARTMENT_OTHER): Payer: BC Managed Care – PPO | Admitting: Internal Medicine

## 2018-10-23 ENCOUNTER — Telehealth: Payer: Self-pay | Admitting: Internal Medicine

## 2018-10-23 VITALS — BP 154/90 | HR 80 | Temp 98.5°F | Resp 17 | Ht 69.0 in | Wt 170.9 lb

## 2018-10-23 DIAGNOSIS — Z79899 Other long term (current) drug therapy: Secondary | ICD-10-CM | POA: Diagnosis not present

## 2018-10-23 DIAGNOSIS — R569 Unspecified convulsions: Secondary | ICD-10-CM

## 2018-10-23 DIAGNOSIS — R200 Anesthesia of skin: Secondary | ICD-10-CM | POA: Diagnosis not present

## 2018-10-23 DIAGNOSIS — C719 Malignant neoplasm of brain, unspecified: Secondary | ICD-10-CM | POA: Insufficient documentation

## 2018-10-23 DIAGNOSIS — G40909 Epilepsy, unspecified, not intractable, without status epilepticus: Secondary | ICD-10-CM | POA: Diagnosis not present

## 2018-10-23 DIAGNOSIS — C711 Malignant neoplasm of frontal lobe: Secondary | ICD-10-CM | POA: Diagnosis not present

## 2018-10-23 MED ORDER — GADOBUTROL 1 MMOL/ML IV SOLN
9.0000 mL | Freq: Once | INTRAVENOUS | Status: AC | PRN
  Administered 2018-10-23: 9 mL via INTRAVENOUS

## 2018-10-23 NOTE — Telephone Encounter (Signed)
Scheduled appt per 9/21 los.  Called patient and she is aware of the appt date and time.
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
## 2018-11-13 ENCOUNTER — Encounter: Payer: Self-pay | Admitting: Internal Medicine

## 2018-11-13 DIAGNOSIS — F4323 Adjustment disorder with mixed anxiety and depressed mood: Secondary | ICD-10-CM | POA: Diagnosis not present

## 2018-11-17 DIAGNOSIS — R300 Dysuria: Secondary | ICD-10-CM | POA: Diagnosis not present

## 2018-11-17 DIAGNOSIS — N3001 Acute cystitis with hematuria: Secondary | ICD-10-CM | POA: Diagnosis not present

## 2018-11-18 DIAGNOSIS — F411 Generalized anxiety disorder: Secondary | ICD-10-CM | POA: Diagnosis not present

## 2018-11-18 DIAGNOSIS — G9389 Other specified disorders of brain: Secondary | ICD-10-CM | POA: Diagnosis not present

## 2018-11-18 DIAGNOSIS — R200 Anesthesia of skin: Secondary | ICD-10-CM | POA: Diagnosis not present

## 2018-11-18 DIAGNOSIS — Z85841 Personal history of malignant neoplasm of brain: Secondary | ICD-10-CM | POA: Diagnosis not present

## 2018-11-18 DIAGNOSIS — N3001 Acute cystitis with hematuria: Secondary | ICD-10-CM | POA: Diagnosis not present

## 2018-11-27 ENCOUNTER — Encounter: Payer: Self-pay | Admitting: Internal Medicine

## 2018-12-11 ENCOUNTER — Other Ambulatory Visit: Payer: Self-pay | Admitting: Radiation Therapy

## 2018-12-14 DIAGNOSIS — F4323 Adjustment disorder with mixed anxiety and depressed mood: Secondary | ICD-10-CM | POA: Diagnosis not present

## 2019-01-02 DIAGNOSIS — F4323 Adjustment disorder with mixed anxiety and depressed mood: Secondary | ICD-10-CM | POA: Diagnosis not present

## 2019-01-30 ENCOUNTER — Inpatient Hospital Stay: Payer: BC Managed Care – PPO | Attending: Internal Medicine | Admitting: Internal Medicine

## 2019-01-30 ENCOUNTER — Ambulatory Visit (HOSPITAL_COMMUNITY)
Admission: RE | Admit: 2019-01-30 | Discharge: 2019-01-30 | Disposition: A | Discharge status: Home / Self Care | Payer: BC Managed Care – PPO | Source: Ambulatory Visit | Attending: Internal Medicine | Admitting: Internal Medicine

## 2019-01-30 ENCOUNTER — Other Ambulatory Visit: Payer: Self-pay

## 2019-01-30 VITALS — BP 140/87 | HR 90 | Temp 98.7°F | Resp 19 | Ht 69.0 in | Wt 165.2 lb

## 2019-01-30 DIAGNOSIS — C719 Malignant neoplasm of brain, unspecified: Secondary | ICD-10-CM | POA: Diagnosis not present

## 2019-01-30 DIAGNOSIS — C711 Malignant neoplasm of frontal lobe: Secondary | ICD-10-CM | POA: Insufficient documentation

## 2019-01-30 DIAGNOSIS — R569 Unspecified convulsions: Secondary | ICD-10-CM

## 2019-01-30 DIAGNOSIS — Z79899 Other long term (current) drug therapy: Secondary | ICD-10-CM | POA: Insufficient documentation

## 2019-01-30 DIAGNOSIS — F419 Anxiety disorder, unspecified: Secondary | ICD-10-CM | POA: Insufficient documentation

## 2019-01-30 LAB — POCT I-STAT CREATININE: Creatinine, Ser: 0.5 mg/dL (ref 0.44–1.00)

## 2019-01-30 MED ORDER — GADOBUTROL 1 MMOL/ML IV SOLN
7.0000 mL | Freq: Once | INTRAVENOUS | Status: AC | PRN
  Administered 2019-01-30: 11:00:00 7 mL via INTRAVENOUS
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
## 2019-01-31 ENCOUNTER — Telehealth: Payer: Self-pay | Admitting: Internal Medicine

## 2019-01-31 NOTE — Telephone Encounter (Signed)
Scheduled appt per 12/29 los.  Sent a message to HIM pool to get a calendar mailed out. 

## 2019-02-11 DIAGNOSIS — R000 Tachycardia, unspecified: Secondary | ICD-10-CM | POA: Diagnosis not present

## 2019-02-11 DIAGNOSIS — R569 Unspecified convulsions: Secondary | ICD-10-CM | POA: Diagnosis not present

## 2019-02-11 DIAGNOSIS — G40909 Epilepsy, unspecified, not intractable, without status epilepticus: Secondary | ICD-10-CM | POA: Diagnosis not present

## 2019-02-11 DIAGNOSIS — Z79899 Other long term (current) drug therapy: Secondary | ICD-10-CM | POA: Diagnosis not present

## 2019-02-12 ENCOUNTER — Telehealth: Payer: Self-pay | Admitting: Internal Medicine

## 2019-02-12 NOTE — Telephone Encounter (Signed)
Left message for patient to verify telephone visit for pre reg °

## 2019-02-13 ENCOUNTER — Inpatient Hospital Stay: Payer: BC Managed Care – PPO | Attending: Internal Medicine | Admitting: Internal Medicine

## 2019-02-13 DIAGNOSIS — R569 Unspecified convulsions: Secondary | ICD-10-CM

## 2019-02-13 MED ORDER — LAMOTRIGINE 100 MG PO TABS: ORAL_TABLET | ORAL | 3 refills | Status: DC

## 2019-02-13 NOTE — Progress Notes (Signed)
I connected with Shirley Fisher on 02/13/19 at 11:30 AM EST by telephone visit and verified that I am speaking with the correct person using two identifiers.  I discussed the limitations, risks, security and privacy concerns of performing an evaluation and management service by telemedicine and the availability of in-person appointments. I also discussed with the patient that there may be a patient responsible charge related to this service. The patient expressed understanding and agreed to proceed.  Other persons participating in the visit and their role in the encounter:  n/a  Patient's location:  Home  Provider's location:  Office  Chief Complaint:  Seizure  History of Present Ilness: Shirley Fisher experienced a breathrough seizure event this weekend.  She describes "right sided shaking, followed by generalized shaking".  This was followed by "garbled speech, drowsiness and lethargy" for several hours.  She did visit the ED but was discharged home.  Semiology was similar to her prior seizure in September.  Well compliant with medications and no provoking factors such as illness, sleep deprivation or acute stressor. Observations: Language and cognition at baseline Assessment and Plan: Focal seizure, unprovoked, secondary to left frontal glioma Follow Up Instructions: Increase Lamictal to 200mg  AM, 100mg  PM.  Follow up after next MRI brain as scheduled or sooner if needed.  I discussed the assessment and treatment plan with the patient.  The patient was provided an opportunity to ask questions and all were answered.  The patient agreed with the plan and demonstrated understanding of the instructions.    The patient was advised to call back or seek an in-person evaluation if the symptoms worsen or if the condition fails to improve as anticipated.  I provided 5-10 minutes of non-face-to-face time during this enocunter.  Ventura Sellers, MD   I provided 15 minutes of non face-to-face telephone  visit time during this encounter, and > 50% was spent counseling as documented under my assessment & plan.

## 2019-02-15 ENCOUNTER — Encounter: Payer: Self-pay | Admitting: Internal Medicine

## 2019-02-15 DIAGNOSIS — F329 Major depressive disorder, single episode, unspecified: Secondary | ICD-10-CM

## 2019-02-15 DIAGNOSIS — F419 Anxiety disorder, unspecified: Secondary | ICD-10-CM

## 2019-02-15 DIAGNOSIS — F32A Depression, unspecified: Secondary | ICD-10-CM

## 2019-03-12 DIAGNOSIS — F331 Major depressive disorder, recurrent, moderate: Secondary | ICD-10-CM | POA: Diagnosis not present

## 2019-03-12 DIAGNOSIS — F445 Conversion disorder with seizures or convulsions: Secondary | ICD-10-CM | POA: Diagnosis not present

## 2019-03-12 DIAGNOSIS — C719 Malignant neoplasm of brain, unspecified: Secondary | ICD-10-CM | POA: Diagnosis not present

## 2019-03-12 DIAGNOSIS — F411 Generalized anxiety disorder: Secondary | ICD-10-CM | POA: Diagnosis not present

## 2019-03-19 DIAGNOSIS — Z113 Encounter for screening for infections with a predominantly sexual mode of transmission: Secondary | ICD-10-CM | POA: Diagnosis not present

## 2019-03-19 DIAGNOSIS — B373 Candidiasis of vulva and vagina: Secondary | ICD-10-CM | POA: Diagnosis not present

## 2019-03-27 ENCOUNTER — Encounter: Payer: Self-pay | Admitting: Internal Medicine

## 2019-03-29 ENCOUNTER — Other Ambulatory Visit: Payer: Self-pay | Admitting: Internal Medicine

## 2019-03-29 MED ORDER — LAMOTRIGINE 100 MG PO TABS: ORAL_TABLET | ORAL | 3 refills | Status: DC

## 2019-04-02 DIAGNOSIS — D496 Neoplasm of unspecified behavior of brain: Secondary | ICD-10-CM | POA: Diagnosis not present

## 2019-04-02 DIAGNOSIS — R030 Elevated blood-pressure reading, without diagnosis of hypertension: Secondary | ICD-10-CM | POA: Diagnosis not present

## 2019-04-09 DIAGNOSIS — C719 Malignant neoplasm of brain, unspecified: Secondary | ICD-10-CM | POA: Diagnosis not present

## 2019-04-09 DIAGNOSIS — F411 Generalized anxiety disorder: Secondary | ICD-10-CM | POA: Diagnosis not present

## 2019-04-09 DIAGNOSIS — F331 Major depressive disorder, recurrent, moderate: Secondary | ICD-10-CM | POA: Diagnosis not present

## 2019-04-09 DIAGNOSIS — F445 Conversion disorder with seizures or convulsions: Secondary | ICD-10-CM | POA: Diagnosis not present

## 2019-04-18 DIAGNOSIS — C719 Malignant neoplasm of brain, unspecified: Secondary | ICD-10-CM | POA: Diagnosis not present

## 2019-04-18 DIAGNOSIS — F411 Generalized anxiety disorder: Secondary | ICD-10-CM | POA: Diagnosis not present

## 2019-04-18 DIAGNOSIS — F445 Conversion disorder with seizures or convulsions: Secondary | ICD-10-CM | POA: Diagnosis not present

## 2019-04-18 DIAGNOSIS — F331 Major depressive disorder, recurrent, moderate: Secondary | ICD-10-CM | POA: Diagnosis not present

## 2019-04-30 ENCOUNTER — Inpatient Hospital Stay: Payer: BC Managed Care – PPO | Admitting: Internal Medicine

## 2019-04-30 ENCOUNTER — Ambulatory Visit (HOSPITAL_COMMUNITY): Admission: RE | Admit: 2019-04-30 | Payer: BC Managed Care – PPO | Source: Ambulatory Visit

## 2019-05-07 DIAGNOSIS — R3989 Other symptoms and signs involving the genitourinary system: Secondary | ICD-10-CM | POA: Diagnosis not present

## 2019-05-11 ENCOUNTER — Other Ambulatory Visit: Payer: Self-pay

## 2019-05-11 ENCOUNTER — Ambulatory Visit (HOSPITAL_COMMUNITY)
Admission: RE | Admit: 2019-05-11 | Discharge: 2019-05-11 | Disposition: A | Discharge status: Home / Self Care | Payer: BC Managed Care – PPO | Source: Ambulatory Visit | Attending: Internal Medicine | Admitting: Internal Medicine

## 2019-05-11 DIAGNOSIS — C719 Malignant neoplasm of brain, unspecified: Secondary | ICD-10-CM | POA: Diagnosis not present

## 2019-05-11 MED ORDER — GADOBUTROL 1 MMOL/ML IV SOLN
7.5000 mL | Freq: Once | INTRAVENOUS | Status: AC | PRN
  Administered 2019-05-11: 20:00:00 7.5 mL via INTRAVENOUS

## 2019-05-14 ENCOUNTER — Telehealth: Payer: Self-pay | Admitting: Internal Medicine

## 2019-05-14 ENCOUNTER — Other Ambulatory Visit: Payer: Self-pay

## 2019-05-14 ENCOUNTER — Inpatient Hospital Stay: Payer: BC Managed Care – PPO | Attending: Internal Medicine | Admitting: Internal Medicine

## 2019-05-14 VITALS — BP 139/92 | HR 94 | Temp 98.7°F | Resp 17 | Ht 69.0 in | Wt 151.8 lb

## 2019-05-14 DIAGNOSIS — F419 Anxiety disorder, unspecified: Secondary | ICD-10-CM | POA: Insufficient documentation

## 2019-05-14 DIAGNOSIS — J45909 Unspecified asthma, uncomplicated: Secondary | ICD-10-CM | POA: Diagnosis not present

## 2019-05-14 DIAGNOSIS — Z79899 Other long term (current) drug therapy: Secondary | ICD-10-CM | POA: Diagnosis not present

## 2019-05-14 DIAGNOSIS — C711 Malignant neoplasm of frontal lobe: Secondary | ICD-10-CM | POA: Diagnosis not present

## 2019-05-14 DIAGNOSIS — C719 Malignant neoplasm of brain, unspecified: Secondary | ICD-10-CM | POA: Diagnosis not present

## 2019-05-14 DIAGNOSIS — R569 Unspecified convulsions: Secondary | ICD-10-CM

## 2019-05-14 NOTE — Telephone Encounter (Signed)
Scheduled appt per 4/12 los.  Printed and mailed appt calendar. 
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
## 2019-05-28 DIAGNOSIS — F445 Conversion disorder with seizures or convulsions: Secondary | ICD-10-CM | POA: Diagnosis not present

## 2019-05-28 DIAGNOSIS — F4010 Social phobia, unspecified: Secondary | ICD-10-CM | POA: Diagnosis not present

## 2019-05-28 DIAGNOSIS — F331 Major depressive disorder, recurrent, moderate: Secondary | ICD-10-CM | POA: Diagnosis not present

## 2019-05-28 DIAGNOSIS — F411 Generalized anxiety disorder: Secondary | ICD-10-CM | POA: Diagnosis not present

## 2019-05-31 DIAGNOSIS — U071 COVID-19: Secondary | ICD-10-CM | POA: Diagnosis not present

## 2019-05-31 DIAGNOSIS — R05 Cough: Secondary | ICD-10-CM | POA: Diagnosis not present

## 2019-06-05 ENCOUNTER — Encounter: Payer: Self-pay | Admitting: Internal Medicine

## 2019-06-05 MED ORDER — LAMOTRIGINE 100 MG PO TABS: ORAL_TABLET | ORAL | 3 refills | Status: DC

## 2019-06-24 ENCOUNTER — Other Ambulatory Visit: Payer: Self-pay | Admitting: Internal Medicine

## 2019-07-04 DIAGNOSIS — F411 Generalized anxiety disorder: Secondary | ICD-10-CM | POA: Diagnosis not present

## 2019-07-04 DIAGNOSIS — F445 Conversion disorder with seizures or convulsions: Secondary | ICD-10-CM | POA: Diagnosis not present

## 2019-07-04 DIAGNOSIS — F4010 Social phobia, unspecified: Secondary | ICD-10-CM | POA: Diagnosis not present

## 2019-07-04 DIAGNOSIS — F331 Major depressive disorder, recurrent, moderate: Secondary | ICD-10-CM | POA: Diagnosis not present

## 2019-08-05 ENCOUNTER — Encounter: Payer: Self-pay | Admitting: Internal Medicine

## 2019-08-08 ENCOUNTER — Telehealth: Payer: Self-pay | Admitting: Internal Medicine

## 2019-08-08 NOTE — Telephone Encounter (Signed)
Contacted patient to verify telephone visit for pre reg °

## 2019-08-09 ENCOUNTER — Inpatient Hospital Stay: Payer: BC Managed Care – PPO | Attending: Internal Medicine | Admitting: Internal Medicine

## 2019-08-09 DIAGNOSIS — R569 Unspecified convulsions: Secondary | ICD-10-CM | POA: Diagnosis not present

## 2019-08-09 DIAGNOSIS — C719 Malignant neoplasm of brain, unspecified: Secondary | ICD-10-CM

## 2019-08-09 NOTE — Progress Notes (Signed)
I connected with Shirley Fisher on 08/09/19 at 12:00 PM EDT by telephone visit and verified that I am speaking with the correct person using two identifiers.  I discussed the limitations, risks, security and privacy concerns of performing an evaluation and management service by telemedicine and the availability of in-person appointments. I also discussed with the patient that there may be a patient responsible charge related to this service. The patient expressed understanding and agreed to proceed.  Other persons participating in the visit and their role in the encounter:  n/a  Patient's location:  Home  Provider's location:  Office  Chief Complaint:  Grade II glioma ([place])  Focal seizures ([place])  History of Present Ilness: Shirley Fisher experienced a seizure over the weekend.  She describes typical semiology of right sided shaking, lasting several minutes.  She has been under very high stress recently due to relationship crisis.  No other provoking factor.  This event was less intense and shorter in nature than prior seizure events. Observations: Language and cognition intact Assessment and Plan: Grade II glioma ([place])  Focal seizures ([place])  Seizure likely provoked in part by high interpersonal stress, poor sleep due to stress Follow Up Instructions: Recommended continuation of Lamictal 200/100.  If event recurs we will consider increasing dose or "bridging" to a time when stressful situation has resolved and patient is back at baseline with social circumstances.  I discussed the assessment and treatment plan with the patient.  The patient was provided an opportunity to ask questions and all were answered.  The patient agreed with the plan and demonstrated understanding of the instructions.    The patient was advised to call back or seek an in-person evaluation if the symptoms worsen or if the condition fails to improve as anticipated.  I provided 5-10 minutes of non-face-to-face time  during this enocunter.  Ventura Sellers, MD   I provided 15 minutes of non face-to-face telephone visit time during this encounter, and > 50% was spent counseling as documented under my assessment & plan.

## 2019-08-10 ENCOUNTER — Telehealth: Payer: Self-pay | Admitting: Internal Medicine

## 2019-08-10 NOTE — Telephone Encounter (Signed)
No 7/8 los.  

## 2019-08-13 DIAGNOSIS — F411 Generalized anxiety disorder: Secondary | ICD-10-CM | POA: Diagnosis not present

## 2019-08-13 DIAGNOSIS — F445 Conversion disorder with seizures or convulsions: Secondary | ICD-10-CM | POA: Diagnosis not present

## 2019-08-13 DIAGNOSIS — F331 Major depressive disorder, recurrent, moderate: Secondary | ICD-10-CM | POA: Diagnosis not present

## 2019-08-13 DIAGNOSIS — F4010 Social phobia, unspecified: Secondary | ICD-10-CM | POA: Diagnosis not present

## 2019-08-28 DIAGNOSIS — Z6822 Body mass index (BMI) 22.0-22.9, adult: Secondary | ICD-10-CM | POA: Diagnosis not present

## 2019-08-28 DIAGNOSIS — Z1331 Encounter for screening for depression: Secondary | ICD-10-CM | POA: Diagnosis not present

## 2019-08-28 DIAGNOSIS — Z716 Tobacco abuse counseling: Secondary | ICD-10-CM | POA: Diagnosis not present

## 2019-08-28 DIAGNOSIS — Z3041 Encounter for surveillance of contraceptive pills: Secondary | ICD-10-CM | POA: Diagnosis not present

## 2019-08-28 DIAGNOSIS — I10 Essential (primary) hypertension: Secondary | ICD-10-CM | POA: Diagnosis not present

## 2019-09-03 ENCOUNTER — Other Ambulatory Visit: Payer: Self-pay | Admitting: Radiation Therapy

## 2019-09-14 ENCOUNTER — Ambulatory Visit (HOSPITAL_COMMUNITY)
Admission: RE | Admit: 2019-09-14 | Discharge: 2019-09-14 | Disposition: A | Discharge status: Home / Self Care | Payer: BC Managed Care – PPO | Source: Ambulatory Visit | Attending: Internal Medicine | Admitting: Internal Medicine

## 2019-09-14 ENCOUNTER — Other Ambulatory Visit: Payer: Self-pay

## 2019-09-14 DIAGNOSIS — R220 Localized swelling, mass and lump, head: Secondary | ICD-10-CM | POA: Diagnosis not present

## 2019-09-14 DIAGNOSIS — R569 Unspecified convulsions: Secondary | ICD-10-CM | POA: Diagnosis not present

## 2019-09-14 DIAGNOSIS — C719 Malignant neoplasm of brain, unspecified: Secondary | ICD-10-CM | POA: Insufficient documentation

## 2019-09-14 DIAGNOSIS — G9389 Other specified disorders of brain: Secondary | ICD-10-CM | POA: Diagnosis not present

## 2019-09-14 DIAGNOSIS — Z85841 Personal history of malignant neoplasm of brain: Secondary | ICD-10-CM | POA: Diagnosis not present

## 2019-09-14 MED ORDER — GADOBUTROL 1 MMOL/ML IV SOLN
6.0000 mL | Freq: Once | INTRAVENOUS | Status: AC | PRN
  Administered 2019-09-14: 6 mL via INTRAVENOUS

## 2019-09-17 ENCOUNTER — Inpatient Hospital Stay: Payer: BC Managed Care – PPO | Attending: Internal Medicine | Admitting: Internal Medicine

## 2019-09-17 ENCOUNTER — Other Ambulatory Visit: Payer: Self-pay

## 2019-09-17 ENCOUNTER — Inpatient Hospital Stay: Payer: BC Managed Care – PPO

## 2019-09-17 VITALS — BP 147/90 | HR 93 | Temp 98.2°F | Resp 18 | Ht 69.0 in | Wt 150.8 lb

## 2019-09-17 DIAGNOSIS — C711 Malignant neoplasm of frontal lobe: Secondary | ICD-10-CM | POA: Insufficient documentation

## 2019-09-17 DIAGNOSIS — C719 Malignant neoplasm of brain, unspecified: Secondary | ICD-10-CM | POA: Diagnosis not present

## 2019-09-17 DIAGNOSIS — Z79899 Other long term (current) drug therapy: Secondary | ICD-10-CM | POA: Insufficient documentation

## 2019-09-17 DIAGNOSIS — F419 Anxiety disorder, unspecified: Secondary | ICD-10-CM | POA: Insufficient documentation

## 2019-09-17 DIAGNOSIS — R569 Unspecified convulsions: Secondary | ICD-10-CM | POA: Diagnosis not present
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
## 2019-09-18 ENCOUNTER — Telehealth: Payer: Self-pay | Admitting: Internal Medicine

## 2019-09-18 NOTE — Telephone Encounter (Signed)
Scheduled per 8/16 los. Pt is aware of appt time and date. 

## 2019-11-27 DIAGNOSIS — F4010 Social phobia, unspecified: Secondary | ICD-10-CM | POA: Diagnosis not present

## 2019-11-27 DIAGNOSIS — F411 Generalized anxiety disorder: Secondary | ICD-10-CM | POA: Diagnosis not present

## 2019-11-27 DIAGNOSIS — F445 Conversion disorder with seizures or convulsions: Secondary | ICD-10-CM | POA: Diagnosis not present

## 2019-11-27 DIAGNOSIS — F331 Major depressive disorder, recurrent, moderate: Secondary | ICD-10-CM | POA: Diagnosis not present

## 2019-11-28 ENCOUNTER — Encounter: Payer: Self-pay | Admitting: Internal Medicine

## 2019-12-06 ENCOUNTER — Other Ambulatory Visit: Payer: Self-pay | Admitting: Radiation Therapy

## 2019-12-06 DIAGNOSIS — D496 Neoplasm of unspecified behavior of brain: Secondary | ICD-10-CM | POA: Diagnosis not present

## 2019-12-07 ENCOUNTER — Other Ambulatory Visit: Payer: Self-pay | Admitting: Radiation Therapy

## 2019-12-24 DIAGNOSIS — F411 Generalized anxiety disorder: Secondary | ICD-10-CM | POA: Diagnosis not present

## 2019-12-24 DIAGNOSIS — F331 Major depressive disorder, recurrent, moderate: Secondary | ICD-10-CM | POA: Diagnosis not present

## 2019-12-24 DIAGNOSIS — F4010 Social phobia, unspecified: Secondary | ICD-10-CM | POA: Diagnosis not present

## 2019-12-24 DIAGNOSIS — F445 Conversion disorder with seizures or convulsions: Secondary | ICD-10-CM | POA: Diagnosis not present

## 2020-01-09 ENCOUNTER — Ambulatory Visit (HOSPITAL_COMMUNITY): Admission: RE | Admit: 2020-01-09 | Payer: BC Managed Care – PPO | Source: Ambulatory Visit

## 2020-01-10 ENCOUNTER — Telehealth: Payer: Self-pay | Admitting: Internal Medicine

## 2020-01-10 NOTE — Telephone Encounter (Signed)
Rescheduled appt per 12/8 incoming call from pt to reschedule. Pt confirmed new appt date and time.

## 2020-01-11 ENCOUNTER — Ambulatory Visit (HOSPITAL_COMMUNITY): Payer: BC Managed Care – PPO

## 2020-01-14 ENCOUNTER — Ambulatory Visit: Payer: BC Managed Care – PPO | Admitting: Internal Medicine

## 2020-01-16 ENCOUNTER — Other Ambulatory Visit: Payer: Self-pay

## 2020-01-16 ENCOUNTER — Ambulatory Visit (HOSPITAL_COMMUNITY)
Admission: RE | Admit: 2020-01-16 | Discharge: 2020-01-16 | Disposition: A | Discharge status: Home / Self Care | Payer: BC Managed Care – PPO | Source: Ambulatory Visit | Attending: Internal Medicine | Admitting: Internal Medicine

## 2020-01-16 DIAGNOSIS — D496 Neoplasm of unspecified behavior of brain: Secondary | ICD-10-CM | POA: Diagnosis not present

## 2020-01-16 DIAGNOSIS — C719 Malignant neoplasm of brain, unspecified: Secondary | ICD-10-CM | POA: Diagnosis not present

## 2020-01-16 DIAGNOSIS — Z9889 Other specified postprocedural states: Secondary | ICD-10-CM | POA: Diagnosis not present

## 2020-01-16 DIAGNOSIS — J3489 Other specified disorders of nose and nasal sinuses: Secondary | ICD-10-CM | POA: Diagnosis not present

## 2020-01-16 MED ORDER — GADOBUTROL 1 MMOL/ML IV SOLN
7.0000 mL | Freq: Once | INTRAVENOUS | Status: AC | PRN
  Administered 2020-01-16: 7 mL via INTRAVENOUS

## 2020-01-18 ENCOUNTER — Other Ambulatory Visit: Payer: Self-pay

## 2020-01-18 ENCOUNTER — Inpatient Hospital Stay: Payer: BC Managed Care – PPO | Attending: Internal Medicine | Admitting: Internal Medicine

## 2020-01-18 VITALS — BP 139/90 | HR 106 | Temp 97.5°F | Resp 18 | Ht 69.0 in | Wt 156.8 lb

## 2020-01-18 DIAGNOSIS — R569 Unspecified convulsions: Secondary | ICD-10-CM | POA: Insufficient documentation

## 2020-01-18 DIAGNOSIS — Z79899 Other long term (current) drug therapy: Secondary | ICD-10-CM | POA: Insufficient documentation

## 2020-01-18 DIAGNOSIS — C719 Malignant neoplasm of brain, unspecified: Secondary | ICD-10-CM | POA: Diagnosis not present

## 2020-01-18 DIAGNOSIS — F419 Anxiety disorder, unspecified: Secondary | ICD-10-CM | POA: Diagnosis not present
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
## 2020-02-18 ENCOUNTER — Encounter: Payer: Self-pay | Admitting: Internal Medicine

## 2020-02-19 ENCOUNTER — Other Ambulatory Visit: Payer: Self-pay | Admitting: Internal Medicine

## 2020-02-27 DIAGNOSIS — F331 Major depressive disorder, recurrent, moderate: Secondary | ICD-10-CM | POA: Diagnosis not present

## 2020-02-27 DIAGNOSIS — F411 Generalized anxiety disorder: Secondary | ICD-10-CM | POA: Diagnosis not present

## 2020-02-27 DIAGNOSIS — F445 Conversion disorder with seizures or convulsions: Secondary | ICD-10-CM | POA: Diagnosis not present

## 2020-02-27 DIAGNOSIS — F4010 Social phobia, unspecified: Secondary | ICD-10-CM | POA: Diagnosis not present

## 2020-03-17 IMAGING — MR MR HEAD WO/W CM
11 of 12 series · 42 of 48 positions shown · IV contrast (15 ml multihance)
Comparison: Brain MRI 12/03/2016.  Outside brain MRI 07/19/2011.

CLINICAL DATA: 47-year-old female with chronic nonenhancing left
middle frontal gyrus lesion compatible with a low-grade primary
neoplasm. Study for stereotactic surgical planning.

EXAM:
MRI HEAD WITHOUT AND WITH CONTRAST
TECHNIQUE: Multiplanar, multiecho pulse sequences of the brain and surrounding
structures were obtained without and with intravenous contrast.
CONTRAST:  15mL MULTIHANCE GADOBENATE DIMEGLUMINE 529 MG/ML IV SOLN

[Series 2: FLAIR · sagittal · 3.0mm · 0.77mm/px · 1 of 45 slices shown (1 of 2)]
[im 1/45]
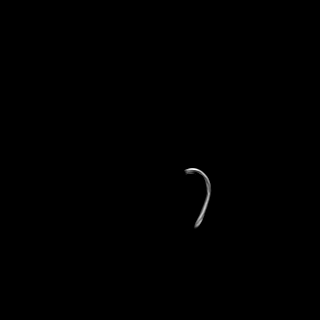

[Series 3: DWI · axial · 3.0mm · 1.50mm/px · z∈[-83,+92]mm · 4 of 92 slices shown (1 of 2)]
[im 1/92]
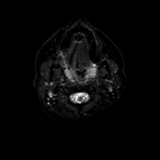
[im 31/92]
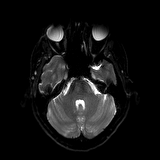
[im 61/92]
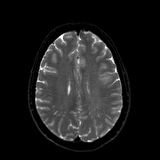
[im 92/92]
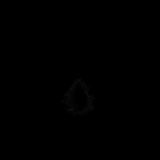

[Series 4: DWI · axial · 3.0mm · 1.50mm/px · z∈[-83,+92]mm · 2 of 46 slices shown (2 of 2)]
[im 1/46]
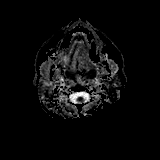
[im 46/46]
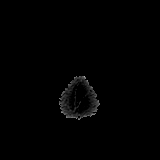

[Series 5: T2 · axial · 5.0mm · 0.57mm/px · 1 of 27 slices shown]
[im 1/27]
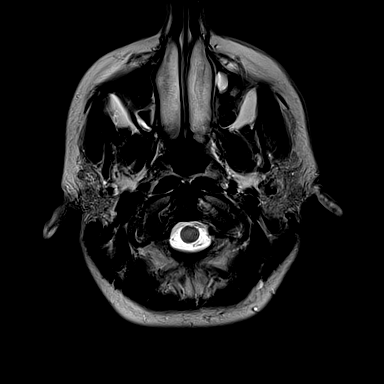

[Series 7: swi_images · axial · 1.5mm · 0.90mm/px · z∈[-65,+89]mm · 5 of 104 slices shown]
[im 1/104]
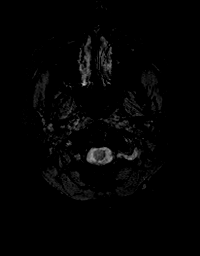
[im 26/104]
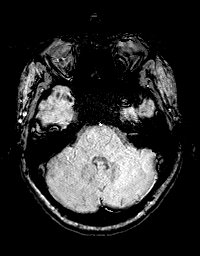
[im 52/104]
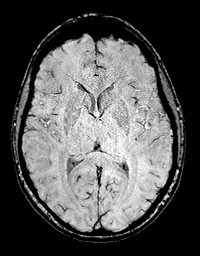
[im 78/104]
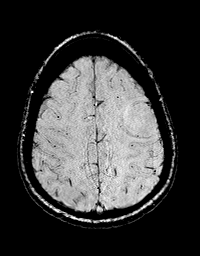
[im 104/104]
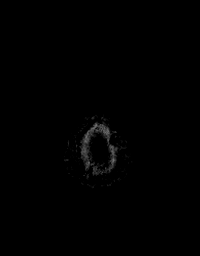

[Series 8: FLAIR · axial · 3.0mm · 0.64mm/px · z∈[-83,+88]mm · 3 of 58 slices shown (2 of 2)]
[im 1/58]
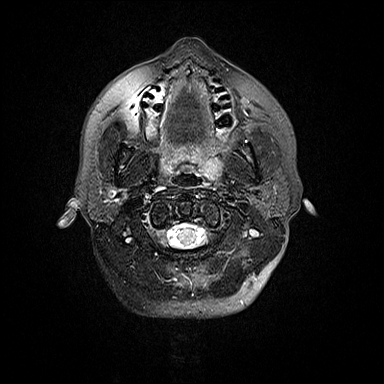
[im 29/58]
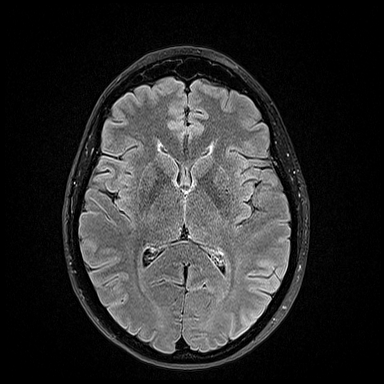
[im 58/58]
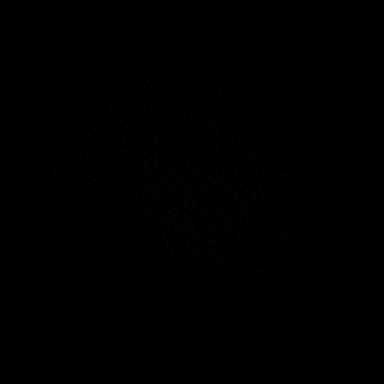

[Series 9: T1 · axial · 1.0mm · 0.77mm/px · z∈[-85,+89]mm · 8 of 175 slices shown]
[im 1/175]
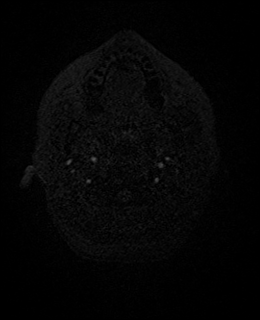
[im 25/175]
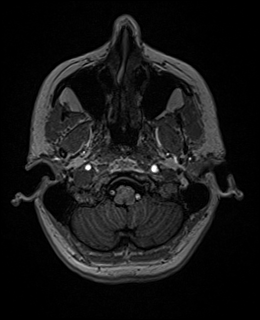
[im 50/175]
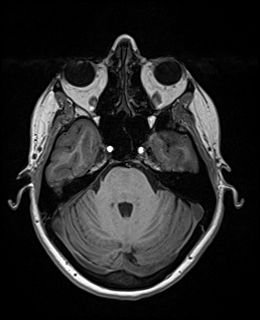
[im 75/175]
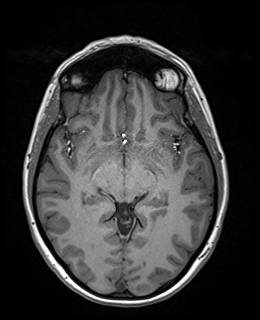
[im 100/175]
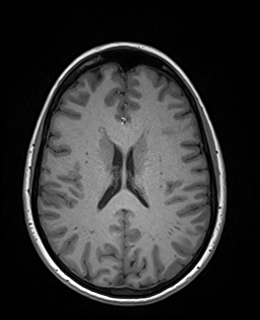
[im 125/175]
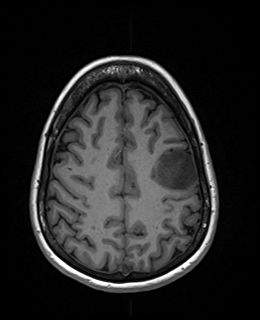
[im 150/175]
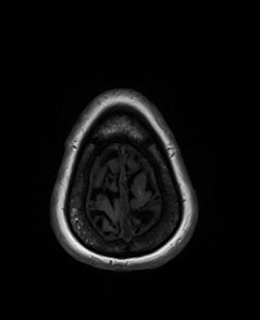
[im 175/175]
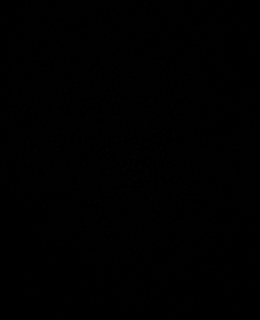

[Series 11: t2_space_dark-fluid_sag_p2_iso · sagittal · 0.9mm · 0.47mm/px · 6 of 192 slices shown]
[im 1/192]
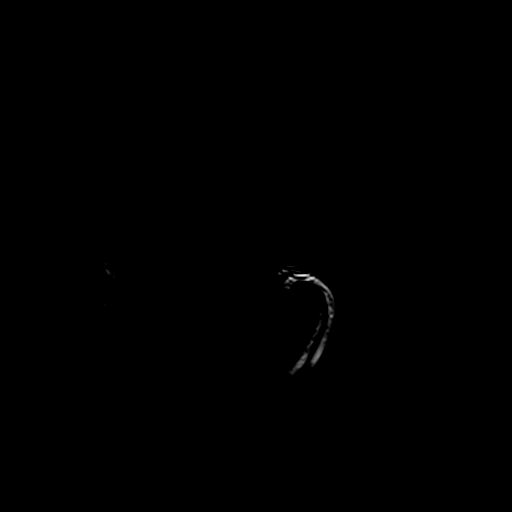
[im 28/192]
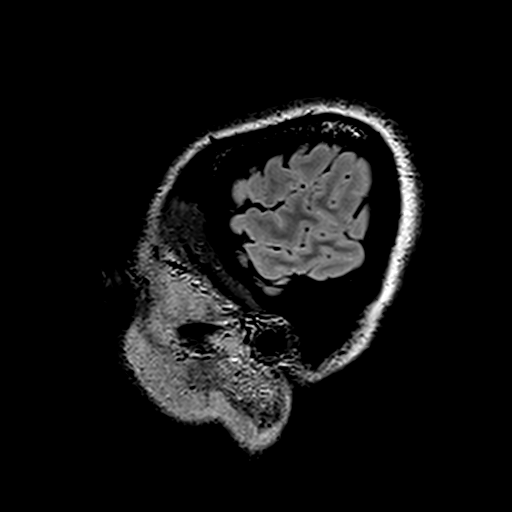
[im 55/192]
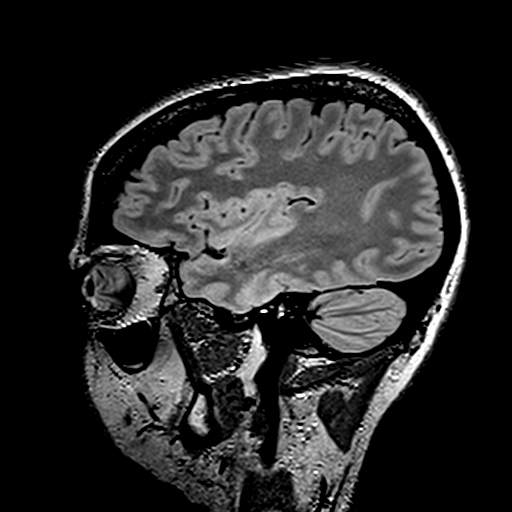
[im 82/192]
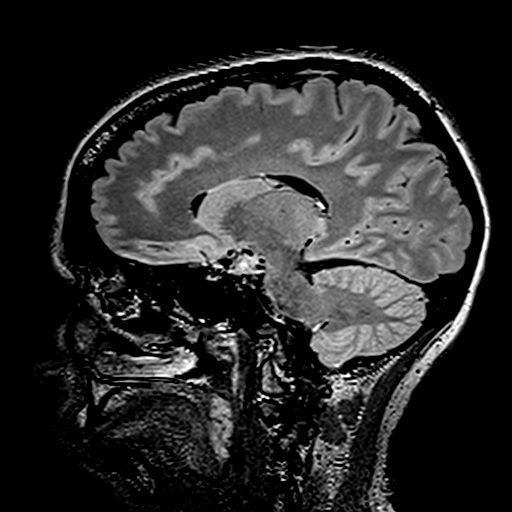
[im 110/192]
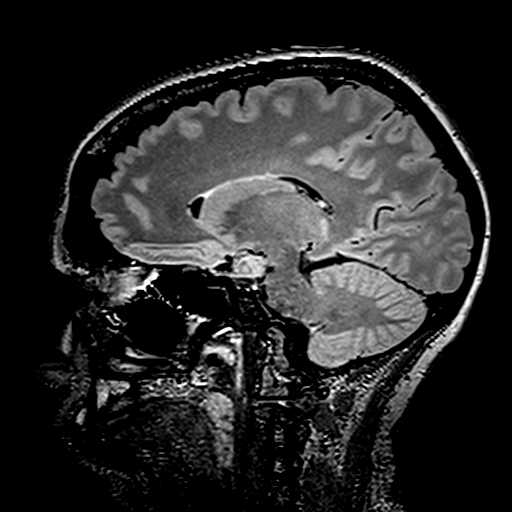
[im 137/192]
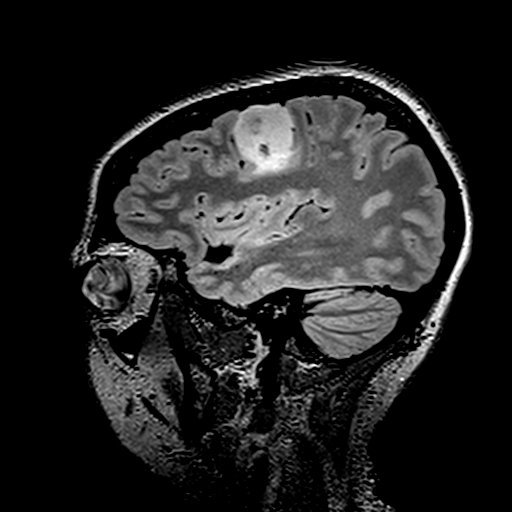

[Series 12: T1 post-contrast · axial · 1.0mm · 0.77mm/px · z∈[-85,+90]mm · 8 of 175 slices shown (1 of 2)]
[im 1/175]
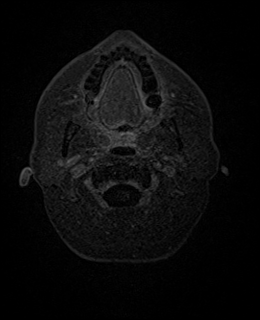
[im 25/175]
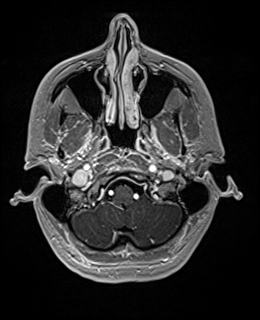
[im 50/175]
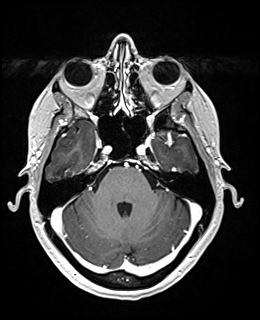
[im 75/175]
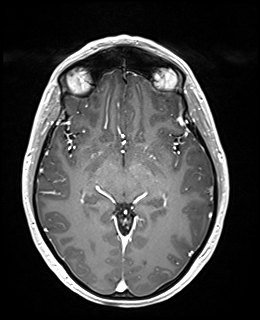
[im 100/175]
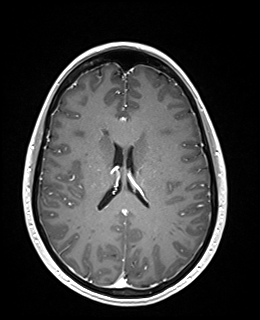
[im 125/175]
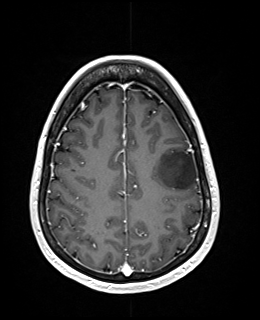
[im 150/175]
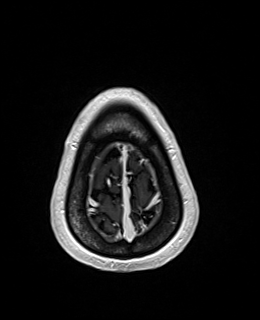
[im 175/175]
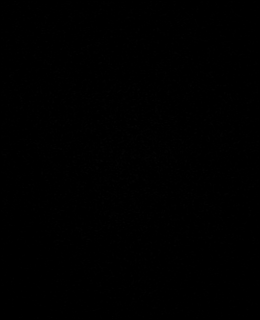

[Series 13: T1 post-contrast · coronal · 3.0mm · 0.57mm/px · 2 of 47 slices shown (2 of 2)]
[im 1/47]
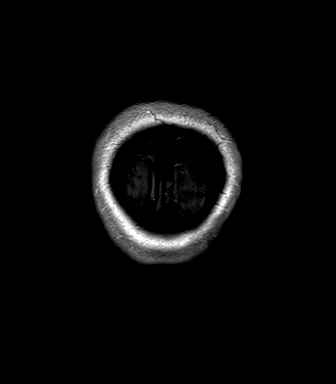
[im 47/47]
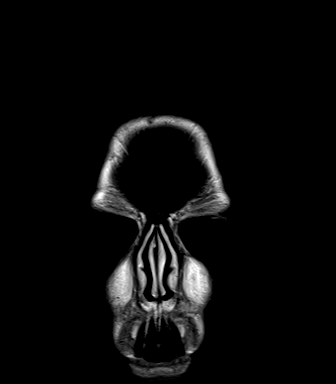

[Series 14: FLAIR post-contrast · sagittal · 3.0mm · 0.77mm/px · 2 of 45 slices shown]
[im 1/45]
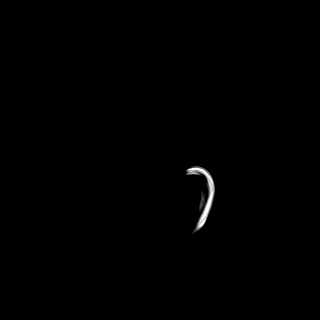
[im 45/45]
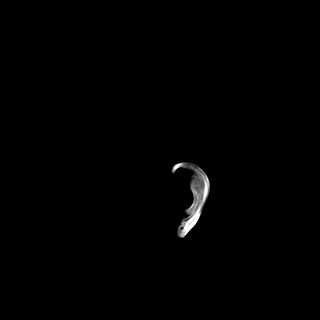

[42 of 48 positions shown; findings below may reference images not displayed]

FINDINGS: Brain: Round chronic nonenhancing T2 and FLAIR hyperintense mass in
the left middle frontal gyrus. Chronic mildly heterogeneous internal
T2 and FLAIR signal. Facilitated diffusion. The deep margin of the
lesion is less distinct on T2 now (series 5, image 20). Mild
regional mass effect is stable. Mildly heterogeneous low to
intermediate T1 signal, and there remains no enhancement following
contrast.

On 3D T2/FLAIR imaging today (series 11 and 102) the lesion
encompasses 31 x 34 x 33 millimeters (AP by transverse by CC). It
was 31 millimeters spherical in 4253.

No new signal abnormality in the brain. No abnormal enhancement
identified. No dural thickening. No restricted diffusion to suggest
acute infarction. No midline shift, ventriculomegaly, extra-axial
collection or acute intracranial hemorrhage. Cervicomedullary
junction and pituitary are within normal limits.

Vascular: Major intracranial vascular flow voids are preserved. The
major dural venous sinuses are enhancing and appear to be patent.

Skull and upper cervical spine: Negative visible cervical spine.
Visualized bone marrow signal is within normal limits.

Sinuses/Orbits: Left sphenoid sinus disease has resolved since 4253.
Negative orbits.

Other: Mastoids are clear. Visible internal auditory structures
appear normal. Scalp and face soft tissues appear negative.
IMPRESSION: 1. Chronic left middle frontal gyrus non-enhancing tumor appears
enlarged by 2-3 mm since 4253. The deep margin of the lesion is
slightly less distinct now. Mild regional mass effect is stable.
Low grade tumor still favored, including oligodendroglioma, DNET,
ganglioglioma.
2. No new intracranial abnormality.

## 2020-04-01 ENCOUNTER — Other Ambulatory Visit: Payer: Self-pay | Admitting: Internal Medicine

## 2020-04-01 NOTE — Telephone Encounter (Signed)
Rx request 

## 2020-04-02 DIAGNOSIS — C711 Malignant neoplasm of frontal lobe: Secondary | ICD-10-CM | POA: Diagnosis not present

## 2020-04-02 DIAGNOSIS — F4010 Social phobia, unspecified: Secondary | ICD-10-CM | POA: Diagnosis not present

## 2020-04-02 DIAGNOSIS — F445 Conversion disorder with seizures or convulsions: Secondary | ICD-10-CM | POA: Diagnosis not present

## 2020-04-02 DIAGNOSIS — F411 Generalized anxiety disorder: Secondary | ICD-10-CM | POA: Diagnosis not present

## 2020-04-11 ENCOUNTER — Encounter: Payer: Self-pay | Admitting: Internal Medicine

## 2020-04-30 ENCOUNTER — Other Ambulatory Visit: Payer: Self-pay | Admitting: Radiation Therapy

## 2020-05-09 IMAGING — CT CT HEAD WITHOUT CONTRAST
1 series · 15 of 30 positions shown, 19 images · non-contrast
Comparison: Brain MRI 02/18/2018 and earlier.  Head CT 07/19/2011.

CLINICAL DATA: 47-year-old female stereotactic surgical planning
for chronic left middle frontal gyrus tumor.

EXAM:
CT HEAD WITHOUT CONTRAST
TECHNIQUE: Contiguous axial images were obtained from the base of the skull
through the vertex without intravenous contrast.

[Series 2: axial standard · axial · 0.51mm/px · z∈[-207,-19]mm · 15 of 202 slices shown, 19 images]
[im 7/202  brain]
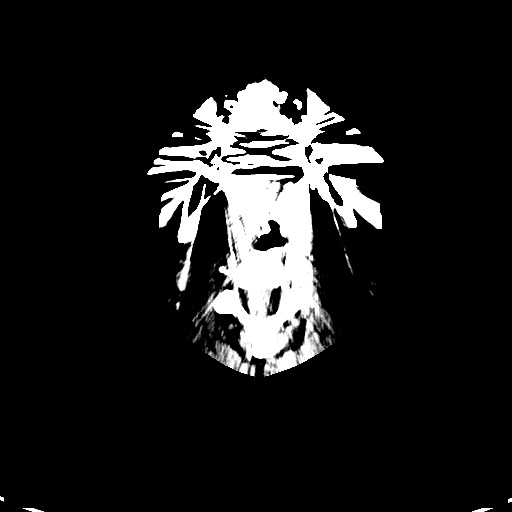
[im 7/202  bone]
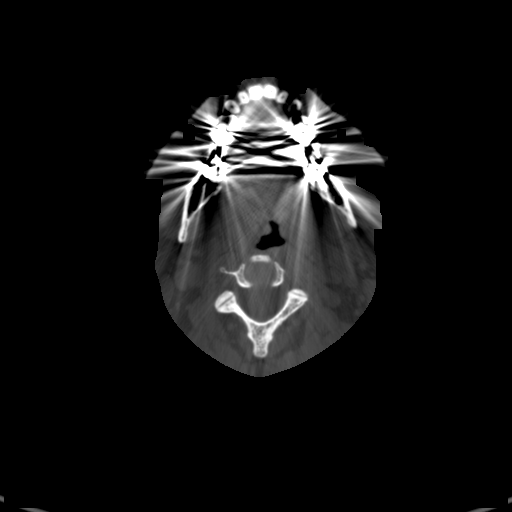
[im 21/202  brain]
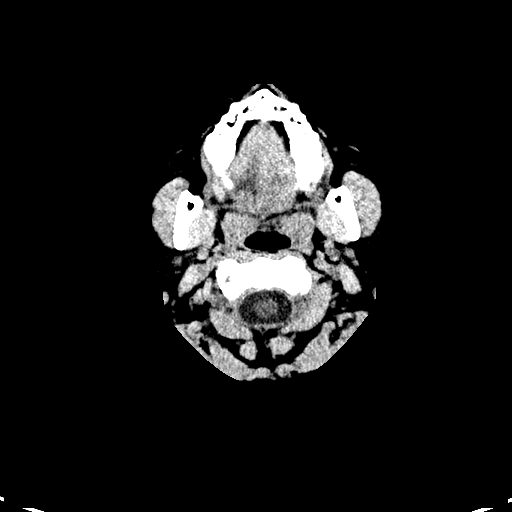
[im 35/202  brain]
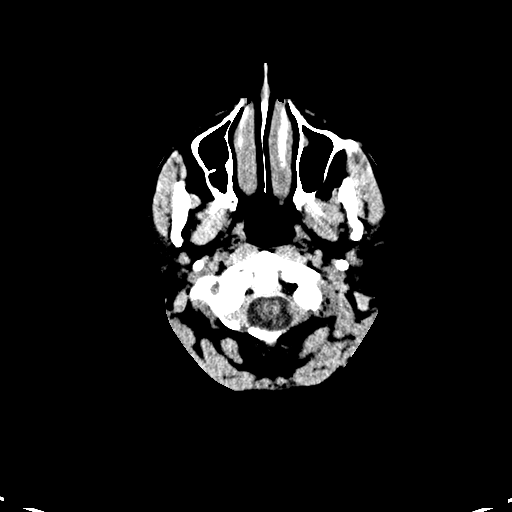
[im 49/202  brain]
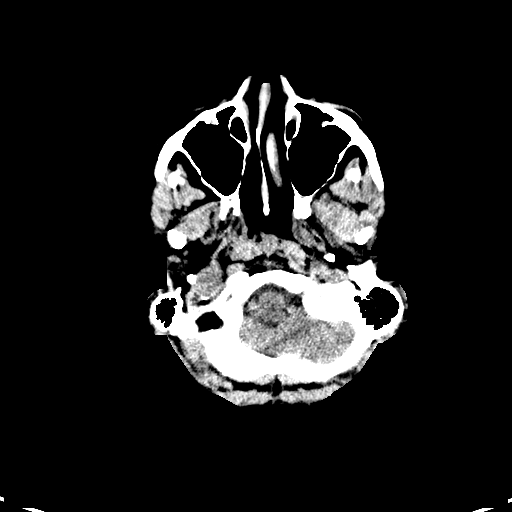
[im 63/202  brain]
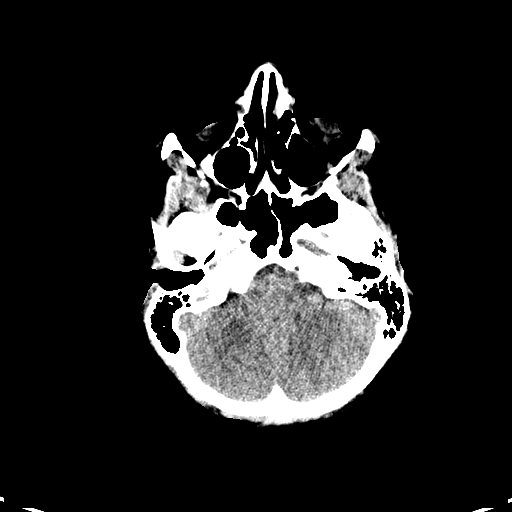
[im 63/202  bone]
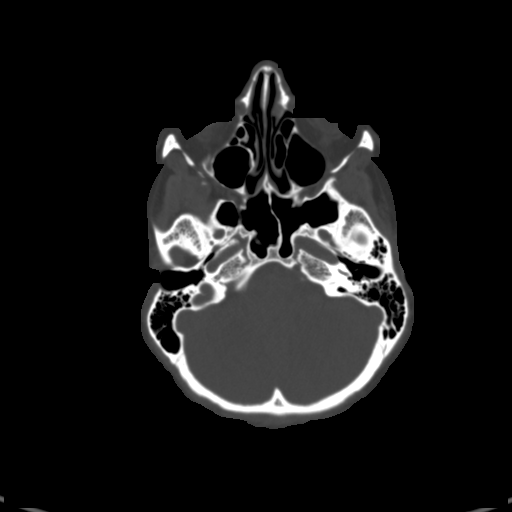
[im 77/202  brain]
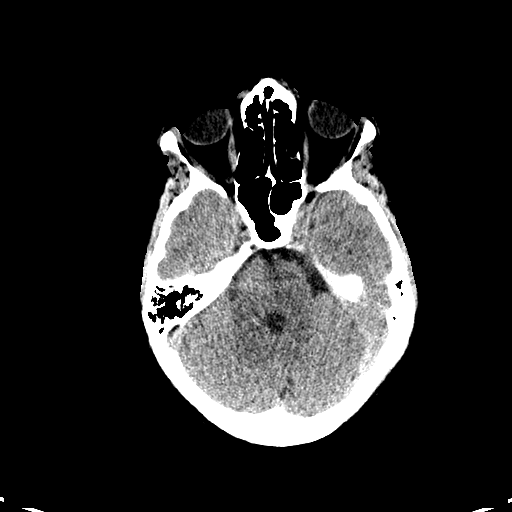
[im 91/202  brain]
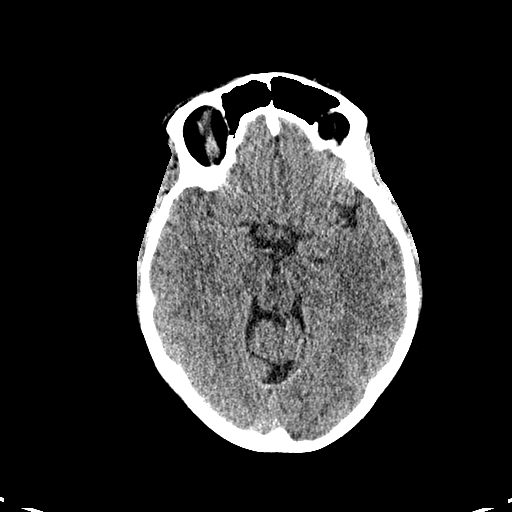
[im 104/202  brain]
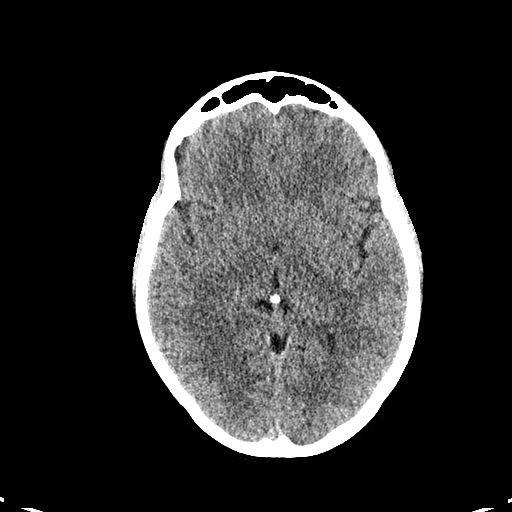
[im 111/202  brain]
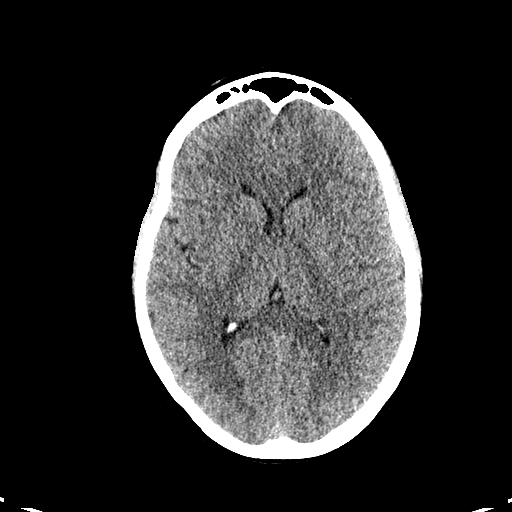
[im 111/202  bone]
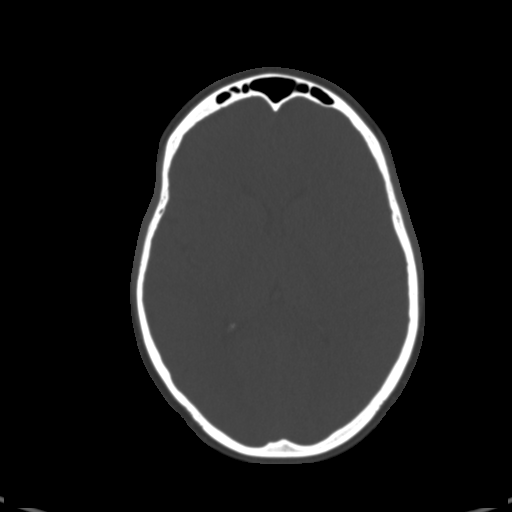
[im 125/202  brain]
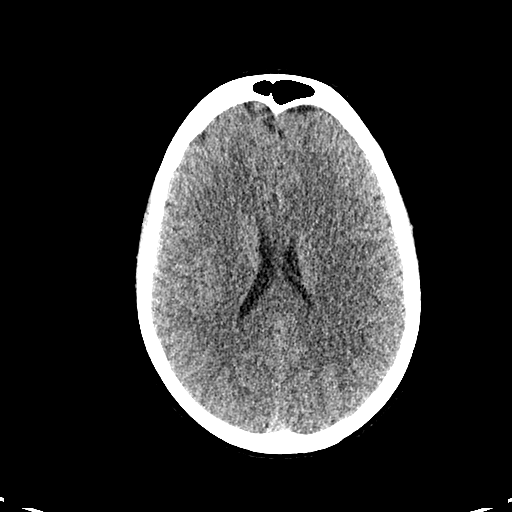
[im 139/202  brain]
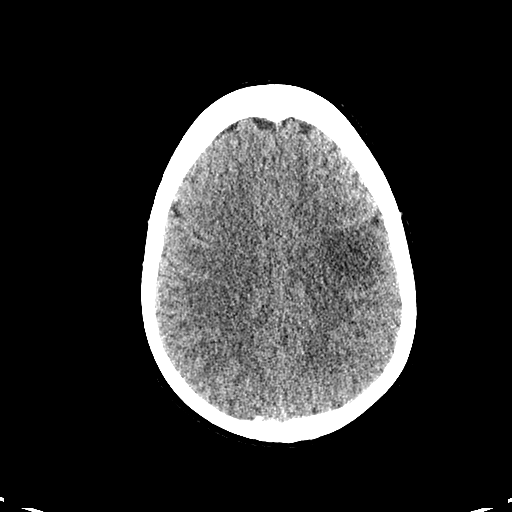
[im 153/202  brain]
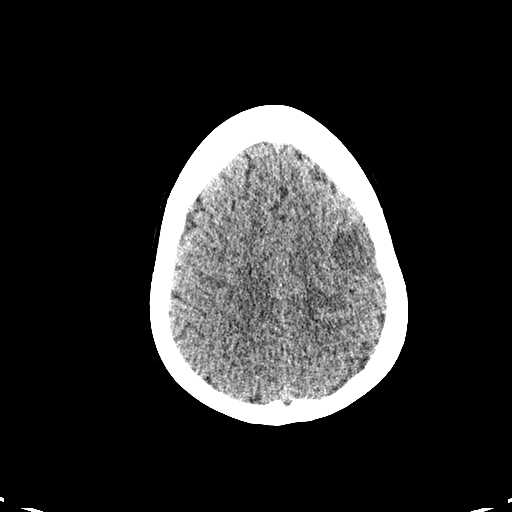
[im 167/202  brain]
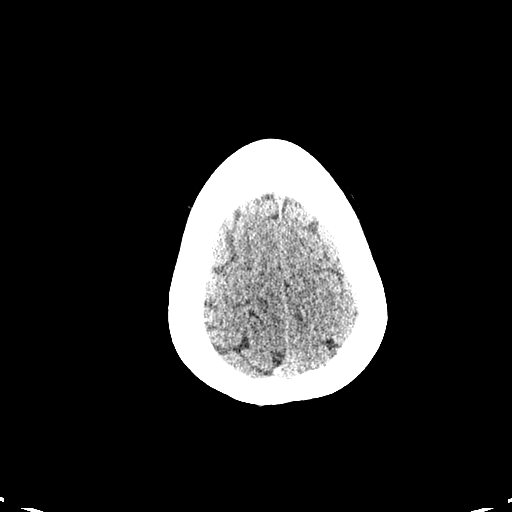
[im 167/202  bone]
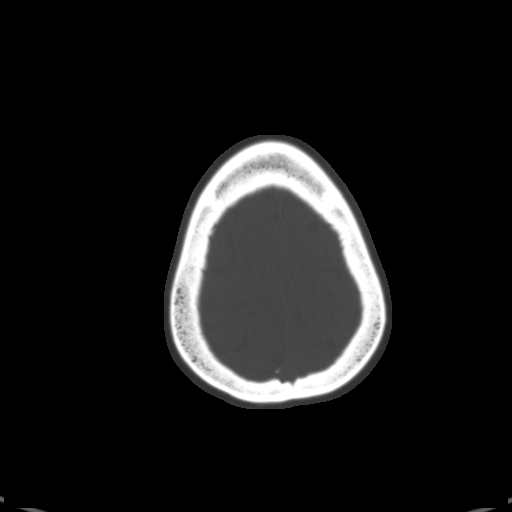
[im 181/202  brain]
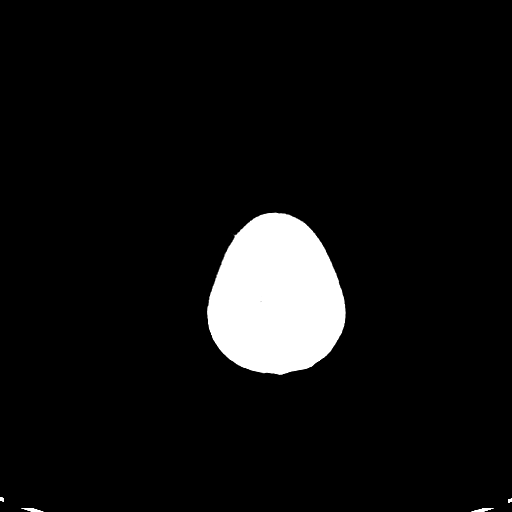
[im 195/202  brain]
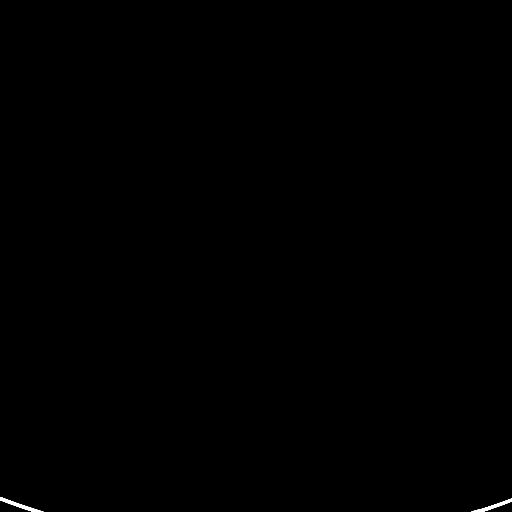

[15 of 30 positions shown; findings below may reference images not displayed]

FINDINGS: Brain: The left middle frontal gyrus lesion is hypodense by CT and
identified with epicenter near series 2 image 140. The posterior
margin in particular is indistinct. Size and configuration appears
stable from the [facility] MRI.

No significant intracranial mass effect. Gray-white matter
differentiation elsewhere appears stable and normal. No
ventriculomegaly. No acute intracranial hemorrhage identified.

Vascular: No suspicious intracranial vascular hyperdensity.

Skull: Negative.

Sinuses/Orbits: Visualized paranasal sinuses and mastoids are mildly
hyperplastic and clear.

Other: Visualized scalp soft tissues are within normal limits.
Negative orbits. Grossly stable and negative visible noncontrast
deep soft tissue spaces of the face.
IMPRESSION: 1. The left middle frontal gyrus mass is hypodense on CT and appears
stable since [REDACTED] with epicenter near series 2 image 140.
2. No new intracranial abnormality.

## 2020-06-09 DIAGNOSIS — F4010 Social phobia, unspecified: Secondary | ICD-10-CM | POA: Diagnosis not present

## 2020-06-09 DIAGNOSIS — F411 Generalized anxiety disorder: Secondary | ICD-10-CM | POA: Diagnosis not present

## 2020-06-09 DIAGNOSIS — F331 Major depressive disorder, recurrent, moderate: Secondary | ICD-10-CM | POA: Diagnosis not present

## 2020-07-01 DIAGNOSIS — G40909 Epilepsy, unspecified, not intractable, without status epilepticus: Secondary | ICD-10-CM | POA: Diagnosis not present

## 2020-07-01 DIAGNOSIS — Z309 Encounter for contraceptive management, unspecified: Secondary | ICD-10-CM | POA: Diagnosis not present

## 2020-07-01 DIAGNOSIS — I10 Essential (primary) hypertension: Secondary | ICD-10-CM | POA: Diagnosis not present

## 2020-07-01 DIAGNOSIS — Z1331 Encounter for screening for depression: Secondary | ICD-10-CM | POA: Diagnosis not present

## 2020-07-01 DIAGNOSIS — R5383 Other fatigue: Secondary | ICD-10-CM | POA: Diagnosis not present

## 2020-07-03 ENCOUNTER — Ambulatory Visit (HOSPITAL_COMMUNITY)
Admission: RE | Admit: 2020-07-03 | Discharge: 2020-07-03 | Disposition: A | Discharge status: Home / Self Care | Payer: BC Managed Care – PPO | Source: Ambulatory Visit | Attending: Internal Medicine | Admitting: Internal Medicine

## 2020-07-03 ENCOUNTER — Other Ambulatory Visit: Payer: Self-pay

## 2020-07-03 DIAGNOSIS — C719 Malignant neoplasm of brain, unspecified: Secondary | ICD-10-CM | POA: Diagnosis not present

## 2020-07-03 MED ORDER — GADOBUTROL 1 MMOL/ML IV SOLN
7.0000 mL | Freq: Once | INTRAVENOUS | Status: AC | PRN
  Administered 2020-07-03: 7 mL via INTRAVENOUS

## 2020-07-07 ENCOUNTER — Inpatient Hospital Stay: Payer: BC Managed Care – PPO | Attending: Internal Medicine

## 2020-07-07 DIAGNOSIS — C711 Malignant neoplasm of frontal lobe: Secondary | ICD-10-CM | POA: Insufficient documentation

## 2020-07-10 ENCOUNTER — Inpatient Hospital Stay: Payer: BC Managed Care – PPO | Admitting: Internal Medicine

## 2020-07-10 ENCOUNTER — Other Ambulatory Visit: Payer: Self-pay

## 2020-07-10 VITALS — BP 134/93 | HR 85 | Temp 97.6°F | Resp 18 | Ht 69.0 in | Wt 168.7 lb

## 2020-07-10 DIAGNOSIS — C719 Malignant neoplasm of brain, unspecified: Secondary | ICD-10-CM

## 2020-07-10 DIAGNOSIS — R569 Unspecified convulsions: Secondary | ICD-10-CM | POA: Diagnosis not present

## 2020-07-10 DIAGNOSIS — C711 Malignant neoplasm of frontal lobe: Secondary | ICD-10-CM | POA: Diagnosis not present
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
## 2020-07-14 ENCOUNTER — Other Ambulatory Visit: Payer: Self-pay | Admitting: Physician Assistant

## 2020-07-14 DIAGNOSIS — Z1231 Encounter for screening mammogram for malignant neoplasm of breast: Secondary | ICD-10-CM

## 2020-08-06 DIAGNOSIS — Z6825 Body mass index (BMI) 25.0-25.9, adult: Secondary | ICD-10-CM | POA: Diagnosis not present

## 2020-08-06 DIAGNOSIS — Z20822 Contact with and (suspected) exposure to covid-19: Secondary | ICD-10-CM | POA: Diagnosis not present

## 2020-08-06 DIAGNOSIS — J019 Acute sinusitis, unspecified: Secondary | ICD-10-CM | POA: Diagnosis not present

## 2020-08-12 ENCOUNTER — Encounter: Payer: Self-pay | Admitting: Internal Medicine

## 2020-08-12 DIAGNOSIS — F4010 Social phobia, unspecified: Secondary | ICD-10-CM | POA: Diagnosis not present

## 2020-08-12 DIAGNOSIS — F411 Generalized anxiety disorder: Secondary | ICD-10-CM | POA: Diagnosis not present

## 2020-08-12 DIAGNOSIS — F331 Major depressive disorder, recurrent, moderate: Secondary | ICD-10-CM | POA: Diagnosis not present

## 2020-08-12 DIAGNOSIS — F445 Conversion disorder with seizures or convulsions: Secondary | ICD-10-CM | POA: Diagnosis not present

## 2020-08-15 ENCOUNTER — Inpatient Hospital Stay: Payer: BC Managed Care – PPO | Attending: Internal Medicine | Admitting: Internal Medicine

## 2020-08-15 DIAGNOSIS — R569 Unspecified convulsions: Secondary | ICD-10-CM | POA: Diagnosis not present

## 2020-08-15 DIAGNOSIS — C719 Malignant neoplasm of brain, unspecified: Secondary | ICD-10-CM

## 2020-08-15 NOTE — Progress Notes (Signed)
I connected with Shirley Fisher on 08/15/20 at 10:15 AM EDT by telephone visit and verified that I am speaking with the correct person using two identifiers.  I discussed the limitations, risks, security and privacy concerns of performing an evaluation and management service by telemedicine and the availability of in-person appointments. I also discussed with the patient that there may be a patient responsible charge related to this service. The patient expressed understanding and agreed to proceed.  Other persons participating in the visit and their role in the encounter:  n/a  Patient's location:  Home  Provider's location:  Office  Chief Complaint:  Focal seizures ([place])  Grade II glioma ([place])  History of Present Ilness: Shirley Fisher describes two breakthrough seizures in the past couple of weeks, both of typical semiology.  The second event occurred while watching her 48 y/o granddaughter, and was quite upsetting, leading to small panic attack.  She would prefer better control of these events.  No other new symptoms. Observations: Language and cognition at baseline Assessment and Plan: Focal seizures ([place])  Grade II glioma (H[facility])  Breakthrough focal seizure/cluster.  Recommended increasing Lamictal to 200mg  BID.  Discussed possibly adding Vimpat or Zonisamide, will hold off for now.  She will call us if further events occur on this higher dose.  Follow Up Instructions: RTC as scheduled following next MRI brain  I discussed the assessment and treatment plan with the patient.  The patient was provided an opportunity to ask questions and all were answered.  The patient agreed with the plan and demonstrated understanding of the instructions.    The patient was advised to call back or seek an in-person evaluation if the symptoms worsen or if the condition fails to improve as anticipated.  I provided 5-10 minutes of non-face-to-face time during this enocunter.  Ventura Sellers, MD   I  provided 15 minutes of non face-to-face telephone visit time during this encounter, and > 50% was spent counseling as documented under my assessment & plan.

## 2020-08-22 DIAGNOSIS — U071 COVID-19: Secondary | ICD-10-CM | POA: Diagnosis not present

## 2020-09-11 ENCOUNTER — Other Ambulatory Visit: Payer: Self-pay | Admitting: Radiation Therapy

## 2020-09-18 ENCOUNTER — Ambulatory Visit
Admission: RE | Admit: 2020-09-18 | Discharge: 2020-09-18 | Disposition: A | Discharge status: Home / Self Care | Payer: BC Managed Care – PPO | Source: Ambulatory Visit | Attending: Physician Assistant | Admitting: Physician Assistant

## 2020-09-18 ENCOUNTER — Other Ambulatory Visit: Payer: Self-pay

## 2020-09-18 DIAGNOSIS — Z1231 Encounter for screening mammogram for malignant neoplasm of breast: Secondary | ICD-10-CM

## 2020-09-23 DIAGNOSIS — D2271 Melanocytic nevi of right lower limb, including hip: Secondary | ICD-10-CM | POA: Diagnosis not present

## 2020-09-23 DIAGNOSIS — D2262 Melanocytic nevi of left upper limb, including shoulder: Secondary | ICD-10-CM | POA: Diagnosis not present

## 2020-09-23 DIAGNOSIS — D1801 Hemangioma of skin and subcutaneous tissue: Secondary | ICD-10-CM | POA: Diagnosis not present

## 2020-09-23 DIAGNOSIS — L570 Actinic keratosis: Secondary | ICD-10-CM | POA: Diagnosis not present

## 2020-09-23 DIAGNOSIS — L814 Other melanin hyperpigmentation: Secondary | ICD-10-CM | POA: Diagnosis not present

## 2020-10-02 DIAGNOSIS — F445 Conversion disorder with seizures or convulsions: Secondary | ICD-10-CM | POA: Diagnosis not present

## 2020-10-02 DIAGNOSIS — F331 Major depressive disorder, recurrent, moderate: Secondary | ICD-10-CM | POA: Diagnosis not present

## 2020-10-02 DIAGNOSIS — F4010 Social phobia, unspecified: Secondary | ICD-10-CM | POA: Diagnosis not present

## 2020-10-02 DIAGNOSIS — F411 Generalized anxiety disorder: Secondary | ICD-10-CM | POA: Diagnosis not present

## 2020-10-07 ENCOUNTER — Other Ambulatory Visit: Payer: Self-pay | Admitting: Internal Medicine

## 2020-10-07 ENCOUNTER — Encounter: Payer: Self-pay | Admitting: Internal Medicine

## 2020-10-07 MED ORDER — LAMOTRIGINE 100 MG PO TABS: ORAL_TABLET | ORAL | 3 refills | Status: DC

## 2020-11-18 DIAGNOSIS — N7689 Other specified inflammation of vagina and vulva: Secondary | ICD-10-CM | POA: Diagnosis not present

## 2020-12-16 DIAGNOSIS — N921 Excessive and frequent menstruation with irregular cycle: Secondary | ICD-10-CM | POA: Diagnosis not present

## 2020-12-16 DIAGNOSIS — N939 Abnormal uterine and vaginal bleeding, unspecified: Secondary | ICD-10-CM | POA: Diagnosis not present

## 2020-12-23 DIAGNOSIS — N939 Abnormal uterine and vaginal bleeding, unspecified: Secondary | ICD-10-CM | POA: Diagnosis not present

## 2020-12-23 DIAGNOSIS — N921 Excessive and frequent menstruation with irregular cycle: Secondary | ICD-10-CM | POA: Diagnosis not present

## 2020-12-24 DIAGNOSIS — D496 Neoplasm of unspecified behavior of brain: Secondary | ICD-10-CM | POA: Diagnosis not present

## 2020-12-24 DIAGNOSIS — I10 Essential (primary) hypertension: Secondary | ICD-10-CM | POA: Diagnosis not present

## 2020-12-30 ENCOUNTER — Other Ambulatory Visit: Payer: Self-pay | Admitting: General Surgery

## 2020-12-30 DIAGNOSIS — D1720 Benign lipomatous neoplasm of skin and subcutaneous tissue of unspecified limb: Secondary | ICD-10-CM | POA: Diagnosis not present

## 2020-12-31 DIAGNOSIS — I10 Essential (primary) hypertension: Secondary | ICD-10-CM | POA: Diagnosis not present

## 2020-12-31 DIAGNOSIS — Z309 Encounter for contraceptive management, unspecified: Secondary | ICD-10-CM | POA: Diagnosis not present

## 2020-12-31 DIAGNOSIS — Z01419 Encounter for gynecological examination (general) (routine) without abnormal findings: Secondary | ICD-10-CM | POA: Diagnosis not present

## 2020-12-31 DIAGNOSIS — G40909 Epilepsy, unspecified, not intractable, without status epilepticus: Secondary | ICD-10-CM | POA: Diagnosis not present

## 2021-01-02 ENCOUNTER — Other Ambulatory Visit: Payer: Self-pay

## 2021-01-02 ENCOUNTER — Ambulatory Visit (HOSPITAL_COMMUNITY)
Admission: RE | Admit: 2021-01-02 | Discharge: 2021-01-02 | Disposition: A | Discharge status: Home / Self Care | Payer: BC Managed Care – PPO | Source: Ambulatory Visit | Attending: Internal Medicine | Admitting: Internal Medicine

## 2021-01-02 DIAGNOSIS — C719 Malignant neoplasm of brain, unspecified: Secondary | ICD-10-CM | POA: Insufficient documentation

## 2021-01-02 DIAGNOSIS — Z9889 Other specified postprocedural states: Secondary | ICD-10-CM | POA: Diagnosis not present

## 2021-01-02 DIAGNOSIS — J329 Chronic sinusitis, unspecified: Secondary | ICD-10-CM | POA: Diagnosis not present

## 2021-01-02 MED ORDER — GADOBUTROL 1 MMOL/ML IV SOLN
8.0000 mL | Freq: Once | INTRAVENOUS | Status: AC | PRN
  Administered 2021-01-02: 8 mL via INTRAVENOUS

## 2021-01-08 ENCOUNTER — Inpatient Hospital Stay: Payer: BC Managed Care – PPO | Attending: Internal Medicine | Admitting: Internal Medicine

## 2021-01-08 ENCOUNTER — Encounter (HOSPITAL_COMMUNITY): Payer: Self-pay

## 2021-01-08 ENCOUNTER — Other Ambulatory Visit: Payer: Self-pay | Admitting: General Surgery

## 2021-01-08 ENCOUNTER — Other Ambulatory Visit: Payer: Self-pay

## 2021-01-08 ENCOUNTER — Encounter (HOSPITAL_COMMUNITY)
Admission: RE | Admit: 2021-01-08 | Discharge: 2021-01-08 | Disposition: A | Discharge status: Home / Self Care | Payer: BC Managed Care – PPO | Source: Ambulatory Visit | Attending: General Surgery | Admitting: General Surgery

## 2021-01-08 VITALS — BP 128/87 | HR 101 | Temp 98.1°F | Resp 20 | Wt 169.2 lb

## 2021-01-08 VITALS — BP 136/91 | HR 104 | Temp 98.1°F | Resp 20 | Ht 69.0 in | Wt 169.1 lb

## 2021-01-08 DIAGNOSIS — C719 Malignant neoplasm of brain, unspecified: Secondary | ICD-10-CM

## 2021-01-08 DIAGNOSIS — Z01818 Encounter for other preprocedural examination: Secondary | ICD-10-CM | POA: Diagnosis not present

## 2021-01-08 HISTORY — DX: Gastro-esophageal reflux disease without esophagitis: K21.9

## 2021-01-08 HISTORY — DX: Dyspnea, unspecified: R06.00

## 2021-01-08 HISTORY — DX: Essential (primary) hypertension: I10

## 2021-01-08 LAB — BASIC METABOLIC PANEL
Anion gap: 10 (ref 5–15)
BUN: 13 mg/dL (ref 6–20)
CO2: 25 mmol/L (ref 22–32)
Calcium: 9.2 mg/dL (ref 8.9–10.3)
Chloride: 101 mmol/L (ref 98–111)
Creatinine, Ser: 0.85 mg/dL (ref 0.44–1.00)
GFR, Estimated: >60 mL/min (ref >60)
Glucose, Bld: 83 mg/dL (ref 70–99)
Potassium: 3.9 mmol/L (ref 3.5–5.1)
Sodium: 136 mmol/L (ref 135–145)

## 2021-01-08 LAB — CBC
HCT: 38.7 % (ref 36.0–46.0)
Hemoglobin: 12.8 g/dL (ref 12.0–15.0)
MCH: 30.3 pg (ref 26.0–34.0)
MCHC: 33.1 g/dL (ref 30.0–36.0)
MCV: 91.5 fL (ref 80.0–100.0)
Platelets: 445 10*3/uL — ABNORMAL HIGH (ref 150–400)
RBC: 4.23 MIL/uL (ref 3.87–5.11)
RDW: 12.3 % (ref 11.5–15.5)
WBC: 7.3 10*3/uL (ref 4.0–10.5)
nRBC: 0 % (ref 0.0–0.2)

## 2021-01-08 MED ORDER — LACOSAMIDE 50 MG PO TABS: 50.0000 mg | ORAL_TABLET | Freq: Two times a day (BID) | ORAL | 5 refills | Status: DC

## 2021-01-08 NOTE — Progress Notes (Addendum)
Surgical Instructions    Your procedure is scheduled on 01/15/21.  Report to [facility] Main Entrance "A" at 8:00 A.M., then check in with the Admitting office.  Call this number if you have problems the morning of surgery:  [phone]   If you have any questions prior to your surgery date call [phone]: Open Monday-Friday 8am-4pm    Remember:  Do not eat after midnight the night before your surgery  You may drink clear liquids until 7:00 the morning of your surgery.   Clear liquids allowed are: Water, Non-Citrus Juices (without pulp), Carbonated Beverages, Clear Tea, Black Coffee ONLY (NO MILK, CREAM OR POWDERED CREAMER of any kind), and Gatorade  Please complete your PRE-SURGERY ENSURE that was provided to you by 7:00 the morning of surgery.  Please, if able, drink it in one setting. DO NOT SIP.     Take these medicines the morning of surgery with A SIP OF WATER:  FLUoxetine (PROZAC)  lamoTRIgine (LAMICTAL)   As Needed: acetaminophen (TYLENOL)  clonazePAM (KLONOPIN)  PE-diphenhydrAMINE-DM-GG-APAP (MUCINEX FAST-MAX DAY/NIGHT PO)   As of today, STOP taking any Aspirin (unless otherwise instructed by your surgeon) Aleve, Naproxen, Ibuprofen, Motrin, Advil, Goody's, BC's, all herbal medications, fish oil, and all vitamins.          Do not wear jewelry or makeup Do not wear lotions, powders, perfumes or deodorant. Do not shave 48 hours prior to surgery.  Do not bring valuables to the hospital. DO Not wear nail polish, gel polish, artificial nails, or any other type of covering on natural nails including finger and toenails. If patients have artificial nails, gel coating, etc. that need to be removed by a nail salon, please have this removed prior to surgery or surgery may need to be canceled/delayed if the surgeon/ anesthesia feels like the patient is unable to be adequately monitored.             [facility] is not responsible for any belongings or valuables.  Do NOT  Smoke (Tobacco/Vaping)  24 hours prior to your procedure  If you use a CPAP at night, you may bring your mask for your overnight stay.   Contacts, glasses, hearing aids, dentures or partials may not be worn into surgery, please bring cases for these belongings   For patients admitted to the hospital, discharge time will be determined by your treatment team.   Patients discharged the day of surgery will not be allowed to drive home, and someone needs to stay with them for 24 hours.  NO VISITORS WILL BE ALLOWED IN PRE-OP WHERE PATIENTS ARE PREPPED FOR SURGERY.  ONLY 1 SUPPORT PERSON MAY BE PRESENT IN THE WAITING ROOM WHILE YOU ARE IN SURGERY.  IF YOU ARE TO BE ADMITTED, ONCE YOU ARE IN YOUR ROOM YOU WILL BE ALLOWED TWO (2) VISITORS. 1 (ONE) VISITOR MAY STAY OVERNIGHT BUT MUST ARRIVE TO THE ROOM BY 8pm.  Minor children may have two parents present. Special consideration for safety and communication needs will be reviewed on a case by case basis.  Special instructions:    Oral Hygiene is also important to reduce your risk of infection.  Remember - BRUSH YOUR TEETH THE MORNING OF SURGERY WITH YOUR REGULAR TOOTHPASTE   [facility]- Preparing For Surgery  Before surgery, you can play an important role. Because skin is not sterile, your skin needs to be as free of germs as possible. You can reduce the number of germs on your skin by washing with CHG (  chlorahexidine gluconate) Soap before surgery.  CHG is an antiseptic cleaner which kills germs and bonds with the skin to continue killing germs even after washing.     Please do not use if you have an allergy to CHG or antibacterial soaps. If your skin becomes reddened/irritated stop using the CHG.  Do not shave (including legs and underarms) for at least 48 hours prior to first CHG shower. It is OK to shave your face.  Please follow these instructions carefully.     Shower the NIGHT BEFORE SURGERY and the MORNING OF SURGERY with CHG Soap.   If you  chose to wash your hair, wash your hair first as usual with your normal shampoo. After you shampoo, rinse your hair and body thoroughly to remove the shampoo.  Then [employer] and genitals (private parts) with your normal soap and rinse thoroughly to remove soap.  After that Use CHG Soap as you would any other liquid soap. You can apply CHG directly to the skin and wash gently with a scrungie or a clean washcloth.   Apply the CHG Soap to your body ONLY FROM THE NECK DOWN.  Do not use on open wounds or open sores. Avoid contact with your eyes, ears, mouth and genitals (private parts). Wash Face and genitals (private parts)  with your normal soap.   Wash thoroughly, paying special attention to the area where your surgery will be performed.  Thoroughly rinse your body with warm water from the neck down.  DO NOT shower/wash with your normal soap after using and rinsing off the CHG Soap.  Pat yourself dry with a CLEAN TOWEL.  Wear CLEAN PAJAMAS to bed the night before surgery  Place CLEAN SHEETS on your bed the night before your surgery  DO NOT SLEEP WITH PETS.   Day of Surgery:  Take a shower with CHG soap. Wear Clean/Comfortable clothing the morning of surgery Do not apply any deodorants/lotions.   Remember to brush your teeth WITH YOUR REGULAR TOOTHPASTE.   Please read over the following fact sheets that you were given.
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
## 2021-01-08 NOTE — Progress Notes (Signed)
PCP: Cyndi Bender -PA-C Cardiologist: denies  EKG:01/08/21 CXR: na ECHO: denies Stress Test: denies Cardiac Cath:denies  Fasting Blood Sugar- na Checks Blood Sugar_na__ times a day  OSA/CPAP: No  ASA/Blood Thinner: No  Covid test not needed - Ambulatory  Anesthesia Review: No  Patient denies shortness of breath, fever, cough, and chest pain at PAT appointment.  Patient verbalized understanding of instructions provided today at the PAT appointment.  Patient asked to review instructions at home and day of surgery.

## 2021-01-09 ENCOUNTER — Telehealth: Payer: Self-pay | Admitting: Internal Medicine

## 2021-01-09 DIAGNOSIS — D496 Neoplasm of unspecified behavior of brain: Secondary | ICD-10-CM | POA: Diagnosis not present

## 2021-01-09 DIAGNOSIS — C719 Malignant neoplasm of brain, unspecified: Secondary | ICD-10-CM | POA: Diagnosis not present

## 2021-01-09 DIAGNOSIS — Z9889 Other specified postprocedural states: Secondary | ICD-10-CM | POA: Diagnosis not present

## 2021-01-09 NOTE — Telephone Encounter (Signed)
Scheduled per 12/8 los, pt has been called and confirmed appt

## 2021-01-11 ENCOUNTER — Encounter: Payer: Self-pay | Admitting: Internal Medicine

## 2021-01-15 ENCOUNTER — Ambulatory Visit (HOSPITAL_COMMUNITY): Payer: BC Managed Care – PPO | Admitting: General Practice

## 2021-01-15 ENCOUNTER — Encounter (HOSPITAL_COMMUNITY): Payer: Self-pay | Admitting: General Surgery

## 2021-01-15 ENCOUNTER — Other Ambulatory Visit: Payer: Self-pay

## 2021-01-15 ENCOUNTER — Encounter (HOSPITAL_COMMUNITY)
Admission: RE | Disposition: A | Discharge status: Home / Self Care | Payer: Self-pay | Source: Home / Self Care | Attending: General Surgery

## 2021-01-15 ENCOUNTER — Ambulatory Visit (HOSPITAL_COMMUNITY)
Admission: RE | Admit: 2021-01-15 | Discharge: 2021-01-15 | Disposition: A | Discharge status: Home / Self Care | Payer: BC Managed Care – PPO | Attending: General Surgery | Admitting: General Surgery

## 2021-01-15 DIAGNOSIS — D1721 Benign lipomatous neoplasm of skin and subcutaneous tissue of right arm: Secondary | ICD-10-CM | POA: Insufficient documentation

## 2021-01-15 DIAGNOSIS — Z79899 Other long term (current) drug therapy: Secondary | ICD-10-CM | POA: Insufficient documentation

## 2021-01-15 DIAGNOSIS — I10 Essential (primary) hypertension: Secondary | ICD-10-CM | POA: Diagnosis not present

## 2021-01-15 DIAGNOSIS — F419 Anxiety disorder, unspecified: Secondary | ICD-10-CM | POA: Diagnosis not present

## 2021-01-15 DIAGNOSIS — K219 Gastro-esophageal reflux disease without esophagitis: Secondary | ICD-10-CM | POA: Diagnosis not present

## 2021-01-15 HISTORY — PX: LIPOMA EXCISION: SHX5283

## 2021-01-15 LAB — POCT PREGNANCY, URINE: Preg Test, Ur: NEGATIVE

## 2021-01-15 SURGERY — EXCISION LIPOMA
Anesthesia: Monitor Anesthesia Care | Laterality: Right

## 2021-01-15 MED ORDER — LACTATED RINGERS IV SOLN: INTRAVENOUS | Status: DC

## 2021-01-15 MED ORDER — DEXAMETHASONE SODIUM PHOSPHATE 10 MG/ML IJ SOLN
INTRAMUSCULAR | Status: DC | PRN
  Administered 2021-01-15: 5 mg via INTRAVENOUS

## 2021-01-15 MED ORDER — CHLORHEXIDINE GLUCONATE CLOTH 2 % EX PADS: 6.0000 | MEDICATED_PAD | Freq: Once | CUTANEOUS | Status: DC

## 2021-01-15 MED ORDER — OXYCODONE HCL 5 MG PO TABS
ORAL_TABLET | ORAL | Status: AC
  Filled 2021-01-15: qty 2

## 2021-01-15 MED ORDER — ACETAMINOPHEN 325 MG PO TABS: 650.0000 mg | ORAL_TABLET | ORAL | Status: DC | PRN

## 2021-01-15 MED ORDER — OXYCODONE HCL 5 MG PO TABS
5.0000 mg | ORAL_TABLET | ORAL | Status: DC | PRN
  Administered 2021-01-15: 10 mg via ORAL

## 2021-01-15 MED ORDER — FENTANYL CITRATE (PF) 250 MCG/5ML IJ SOLN
INTRAMUSCULAR | Status: AC
  Filled 2021-01-15: qty 5

## 2021-01-15 MED ORDER — MIDAZOLAM HCL 2 MG/2ML IJ SOLN
INTRAMUSCULAR | Status: DC | PRN
  Administered 2021-01-15: 2 mg via INTRAVENOUS

## 2021-01-15 MED ORDER — ORAL CARE MOUTH RINSE: 15.0000 mL | Freq: Once | OROMUCOSAL | Status: AC

## 2021-01-15 MED ORDER — LIDOCAINE 2% (20 MG/ML) 5 ML SYRINGE
INTRAMUSCULAR | Status: AC
  Filled 2021-01-15: qty 5

## 2021-01-15 MED ORDER — ENSURE PRE-SURGERY PO LIQD: 296.0000 mL | Freq: Once | ORAL | Status: DC

## 2021-01-15 MED ORDER — FENTANYL CITRATE (PF) 100 MCG/2ML IJ SOLN: 25.0000 ug | INTRAMUSCULAR | Status: DC | PRN

## 2021-01-15 MED ORDER — ONDANSETRON HCL 4 MG/2ML IJ SOLN
INTRAMUSCULAR | Status: AC
  Filled 2021-01-15: qty 2

## 2021-01-15 MED ORDER — ACETAMINOPHEN 500 MG PO TABS
1000.0000 mg | ORAL_TABLET | ORAL | Status: AC
  Administered 2021-01-15: 1000 mg via ORAL
  Filled 2021-01-15: qty 2

## 2021-01-15 MED ORDER — LIDOCAINE 2% (20 MG/ML) 5 ML SYRINGE
INTRAMUSCULAR | Status: DC | PRN
  Administered 2021-01-15: 60 mg via INTRAVENOUS

## 2021-01-15 MED ORDER — MIDAZOLAM HCL 2 MG/2ML IJ SOLN
INTRAMUSCULAR | Status: AC
  Filled 2021-01-15: qty 2

## 2021-01-15 MED ORDER — BUPIVACAINE-EPINEPHRINE (PF) 0.25% -1:200000 IJ SOLN
INTRAMUSCULAR | Status: AC
  Filled 2021-01-15: qty 30

## 2021-01-15 MED ORDER — DEXAMETHASONE SODIUM PHOSPHATE 10 MG/ML IJ SOLN
INTRAMUSCULAR | Status: AC
  Filled 2021-01-15: qty 1

## 2021-01-15 MED ORDER — SODIUM CHLORIDE 0.9 % IV SOLN: 250.0000 mL | INTRAVENOUS | Status: DC | PRN

## 2021-01-15 MED ORDER — CEFAZOLIN SODIUM-DEXTROSE 2-4 GM/100ML-% IV SOLN
2.0000 g | INTRAVENOUS | Status: AC
  Administered 2021-01-15: 2 g via INTRAVENOUS
  Filled 2021-01-15: qty 100

## 2021-01-15 MED ORDER — SODIUM CHLORIDE 0.9% FLUSH: 3.0000 mL | INTRAVENOUS | Status: DC | PRN

## 2021-01-15 MED ORDER — PROPOFOL 10 MG/ML IV BOLUS
INTRAVENOUS | Status: DC | PRN
  Administered 2021-01-15: 30 mg via INTRAVENOUS
  Administered 2021-01-15 (×2): 20 mg via INTRAVENOUS

## 2021-01-15 MED ORDER — BUPIVACAINE-EPINEPHRINE 0.25% -1:200000 IJ SOLN
INTRAMUSCULAR | Status: DC | PRN
  Administered 2021-01-15: 1 mL

## 2021-01-15 MED ORDER — PROPOFOL 500 MG/50ML IV EMUL
INTRAVENOUS | Status: DC | PRN
  Administered 2021-01-15: 75 ug/kg/min via INTRAVENOUS

## 2021-01-15 MED ORDER — ONDANSETRON HCL 4 MG/2ML IJ SOLN
INTRAMUSCULAR | Status: DC | PRN
  Administered 2021-01-15: 4 mg via INTRAVENOUS

## 2021-01-15 MED ORDER — CHLORHEXIDINE GLUCONATE 0.12 % MT SOLN
15.0000 mL | Freq: Once | OROMUCOSAL | Status: AC
  Administered 2021-01-15: 15 mL via OROMUCOSAL
  Filled 2021-01-15: qty 15

## 2021-01-15 MED ORDER — FENTANYL CITRATE (PF) 250 MCG/5ML IJ SOLN
INTRAMUSCULAR | Status: DC | PRN
  Administered 2021-01-15: 50 ug via INTRAVENOUS

## 2021-01-15 MED ORDER — ACETAMINOPHEN 650 MG RE SUPP: 650.0000 mg | RECTAL | Status: DC | PRN

## 2021-01-15 SURGICAL SUPPLY — 33 items
BAG COUNTER SPONGE SURGICOUNT (BAG) ×1 IMPLANT
BLADE CLIPPER SURG (BLADE) IMPLANT
CHLORAPREP W/TINT 26 (MISCELLANEOUS) ×2 IMPLANT
CLSR STERI-STRIP ANTIMIC 1/2X4 (GAUZE/BANDAGES/DRESSINGS) ×1 IMPLANT
COVER SURGICAL LIGHT HANDLE (MISCELLANEOUS) ×2 IMPLANT
DECANTER SPIKE VIAL GLASS SM (MISCELLANEOUS) ×1 IMPLANT
DERMABOND ADVANCED (GAUZE/BANDAGES/DRESSINGS) ×1
DERMABOND ADVANCED .7 DNX12 (GAUZE/BANDAGES/DRESSINGS) ×1 IMPLANT
DRAPE LAPAROTOMY 100X72 PEDS (DRAPES) ×1 IMPLANT
ELECT CAUTERY BLADE 6.4 (BLADE) ×1 IMPLANT
ELECT REM PT RETURN 9FT ADLT (ELECTROSURGICAL) ×2
ELECTRODE REM PT RTRN 9FT ADLT (ELECTROSURGICAL) ×1 IMPLANT
GLOVE SURG ENC MOIS LTX SZ7 (GLOVE) ×2 IMPLANT
GLOVE SURG UNDER POLY LF SZ7.5 (GLOVE) ×2 IMPLANT
GOWN STRL REUS W/ TWL LRG LVL3 (GOWN DISPOSABLE) ×2 IMPLANT
GOWN STRL REUS W/TWL LRG LVL3 (GOWN DISPOSABLE) ×4
KIT BASIN OR (CUSTOM PROCEDURE TRAY) ×2 IMPLANT
KIT TURNOVER KIT B (KITS) ×2 IMPLANT
NDL HYPO 25GX1X1/2 BEV (NEEDLE) ×1 IMPLANT
NEEDLE HYPO 25GX1X1/2 BEV (NEEDLE) ×2 IMPLANT
NS IRRIG 1000ML POUR BTL (IV SOLUTION) ×2 IMPLANT
PACK GENERAL/GYN (CUSTOM PROCEDURE TRAY) ×2 IMPLANT
PAD ARMBOARD 7.5X6 YLW CONV (MISCELLANEOUS) ×4 IMPLANT
PENCIL SMOKE EVACUATOR (MISCELLANEOUS) ×1 IMPLANT
SPECIMEN JAR SMALL (MISCELLANEOUS) IMPLANT
SUT MNCRL AB 4-0 PS2 18 (SUTURE) ×2 IMPLANT
SUT VIC AB 3-0 SH 27 (SUTURE) ×2
SUT VIC AB 3-0 SH 27XBRD (SUTURE) ×1 IMPLANT
SUT VICRYL 4-0 PS2 18IN ABS (SUTURE) ×1 IMPLANT
SUT VICRYL AB 2 0 TIES (SUTURE) ×1 IMPLANT
SYR CONTROL 10ML LL (SYRINGE) ×2 IMPLANT
TOWEL GREEN STERILE (TOWEL DISPOSABLE) ×2 IMPLANT
TOWEL GREEN STERILE FF (TOWEL DISPOSABLE) ×2 IMPLANT

## 2021-01-15 NOTE — Op Note (Signed)
Preoperative diagnosis: Right posterior shoulder lipoma Postoperative diagnosis: 2 x 2 centimeter subfascial right posterior shoulder lipoma Procedure: Excision of right posterior shoulder lipoma Surgeon: Dr. Serita Grammes Anesthesia: Local with monitored anesthesia care Estimated blood loss: Minimal Specimens: Lipoma to pathology Complications: None Drains: None Special count was correct completion Decision to recovery stable condition  Indications: This a 50 year old female who has a history of a debulking craniotomy for a grade 2 glioma.  This is being followed at this point.  She has a mass in her right upper back that is causing her some discomfort and is concerning to her.  We discussed excision.  Procedure: After informed consent was obtained the patient was taken to the operating.  She was given antibiotics.  SCDs were in place.  She was placed in the left lateral position and appropriately padded.  She was placed under monitored anesthesia care.  She was then prepped and draped in the standard sterile surgical fashion.  A surgical timeout was then performed.  I filtrated Marcaine.  I made incision on the line of a tattoo.  This was right over the lipoma.  I dissected through the fascia and was able to identify a small 2 x 2 lipoma.  There were several parts that I removed in total.  These were passed off the table as a specimen.  Hemostasis was observed.  There was no other palpable abnormality present.  I then closed this with 3-0 Vicryl and 4-0 Monocryl.  Glue and a Steri-Strip were applied.  She tolerated this well was transferred to recovery stable.

## 2021-01-15 NOTE — Interval H&P Note (Signed)
History and Physical Interval Note:  01/15/2021 9:28 AM  Shirley Fisher  has presented today for surgery, with the diagnosis of RIGHT SHOULDER LIPOMA.  The various methods of treatment have been discussed with the patient and family. After consideration of risks, benefits and other options for treatment, the patient has consented to  Procedure(s) with comments: RIGHT SHOULDER LIPOMA EXCISION (Right) - LOCAL & MAC as a surgical intervention.  The patient's history has been reviewed, patient examined, no change in status, stable for surgery.  I have reviewed the patient's chart and labs.  Questions were answered to the patient's satisfaction.     Rolm Bookbinder

## 2021-01-15 NOTE — Transfer of Care (Signed)
Immediate Anesthesia Transfer of Care Note  Patient: Shirley Fisher  Procedure(s) Performed: RIGHT SHOULDER LIPOMA EXCISION (Right)  Patient Location: PACU  Anesthesia Type:MAC  Level of Consciousness: awake, alert  and oriented  Airway & Oxygen Therapy: Patient Spontanous Breathing  Post-op Assessment: Report given to RN and Post -op Vital signs reviewed and stable  Post vital signs: Reviewed and stable  Last Vitals:  Vitals Value Taken Time  BP    Temp    Pulse 80 01/15/21 1121  Resp 11 01/15/21 1121  SpO2 99 % 01/15/21 1121  Vitals shown include unvalidated device data.  Last Pain:  Vitals:   01/15/21 0749  TempSrc:   PainSc: 0-No pain         Complications: No notable events documented.

## 2021-01-15 NOTE — Anesthesia Procedure Notes (Signed)
Procedure Name: MAC Date/Time: 01/15/2021 10:35 AM Performed by: Trinna Post., CRNA Pre-anesthesia Checklist: Patient identified, Emergency Drugs available, Suction available, Patient being monitored and Timeout performed Patient Re-evaluated:Patient Re-evaluated prior to induction Oxygen Delivery Method: Simple face mask Preoxygenation: Pre-oxygenation with 100% oxygen Induction Type: IV induction Placement Confirmation: positive ETCO2

## 2021-01-15 NOTE — H&P (Signed)
°  50 y.o. female who is seen today as an office consultation at the request of Dr. Pasty Arch for evaluation of Lipoma .  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 Since then Shirley Fisher had noted a incidental left frontal mass after a CT scan. Shirley Fisher then underwent a debulking craniotomy on April 19, 2018 with neurosurgery. Shirley Fisher has had seizures since then. These are about once a month now. These are managed with medication. The pathology from that was a grade 2 glioma. Shirley Fisher is currently being followed by brain MRI by oncology. Shirley Fisher is noted a mass in her right upper back. This is in her posterior shoulder. This is soft and does cause her some discomfort. There is no drainage. Shirley Fisher would like this evaluated and considered for excision.  Review of Systems: A complete review of systems was obtained from the patient. I have reviewed this information and discussed as appropriate with the patient. See HPI as well for other ROS.  Review of Systems  Neurological: Positive for seizures.  Psychiatric/Behavioral: The patient is nervous/anxious.  All other systems reviewed and are negative.   Medical History: Past Medical History:  Diagnosis Date   Anxiety   Arthritis   Hypertension   Seizures (CMS-HCC)   There is no problem list on file for this patient.  Past Surgical History:  Procedure Laterality Date   COMBINED AUGMENTATION MAMMAPLASTY AND ABDOMINOPLASTY   CRANIOTOMY FOR BRAIN TUMOR RESECTION    No Known Allergies  Current Outpatient Medications on File Prior to Visit  Medication Sig Dispense Refill   clonazePAM (KLONOPIN) 0.5 MG tablet Take by mouth   losartan (COZAAR) 25 MG tablet Take by mouth   TRI-SPRINTEC, 28, 0.18/0.215/0.25 mg-35 mcg (28) tab   No current facility-administered medications on file prior to visit.   Family History  Problem Relation Age of Onset   High blood pressure (Hypertension) Mother   Hyperlipidemia (Elevated cholesterol) Mother   Deep vein thrombosis (DVT or  abnormal blood clot formation) Mother   High blood pressure (Hypertension) Father   Obesity Sister   Obesity Brother    Social History   Tobacco Use  Smoking Status Never  Smokeless Tobacco Never    Social History   Socioeconomic History   Marital status: Divorced  Tobacco Use   Smoking status: Never   Smokeless tobacco: Never  Vaping Use   Vaping Use: Never used  Substance and Sexual Activity   Alcohol use: Never   Drug use: Never   Objective:   Vitals:  12/30/20 1444  BP: 110/84  Pulse: 96  Temp: 36.7 C (98.1 F)  SpO2: 97%  Weight: 75.3 kg (166 lb)  Height: 175.3 cm (5\' 9" )   Body mass index is 24.51 kg/m.  Physical Exam Constitutional:  Appearance: Normal appearance.  Chest:  Comments: Right posterior shoulder with 2x2 cm subq mass, soft, mobile c/w lipoma Neurological:  Mental Status: Shirley Fisher is alert.   Assessment and Plan:   Lipoma of shoulder  We discussed her options today. I do not think this is mandatory that he gets excised. But with her history as well as the symptoms that are occurring with that I think it is reasonable to consider excision. We discussed excision under local with monitored anesthesia care at the surgery center. We discussed recovery as well. We are going to go ahead and schedule her.

## 2021-01-15 NOTE — Anesthesia Preprocedure Evaluation (Addendum)
Anesthesia Evaluation  Patient identified by MRN, date of birth, ID band Patient awake    Reviewed: Allergy & Precautions, NPO status , Patient's Chart, lab work & pertinent test results  Airway Mallampati: I  TM Distance: >3 FB Neck ROM: Full    Dental no notable dental hx. (+) Teeth Intact, Dental Advisory Given   Pulmonary asthma ,    Pulmonary exam normal breath sounds clear to auscultation       Cardiovascular hypertension, negative cardio ROS Normal cardiovascular exam Rhythm:Regular Rate:Normal     Neuro/Psych Seizures - (last seizure Nov 2022, usually occur once per month),  PSYCHIATRIC DISORDERS Anxiety Grade II glioma s/p resection 2020    GI/Hepatic negative GI ROS, Neg liver ROS,   Endo/Other  negative endocrine ROS  Renal/GU negative Renal ROS  negative genitourinary   Musculoskeletal  (+) Arthritis ,   Abdominal   Peds  Hematology negative hematology ROS (+)   Anesthesia Other Findings   Reproductive/Obstetrics                            Anesthesia Physical Anesthesia Plan  ASA: 2  Anesthesia Plan: MAC   Post-op Pain Management:    Induction: Intravenous  PONV Risk Score and Plan: 2 and Propofol infusion, Treatment may vary due to age or medical condition, Midazolam and Ondansetron  Airway Management Planned: Natural Airway  Additional Equipment:   Intra-op Plan:   Post-operative Plan:   Informed Consent: I have reviewed the patients History and Physical, chart, labs and discussed the procedure including the risks, benefits and alternatives for the proposed anesthesia with the patient or authorized representative who has indicated his/her understanding and acceptance.     Dental advisory given  Plan Discussed with: CRNA  Anesthesia Plan Comments:         Anesthesia Quick Evaluation

## 2021-01-15 NOTE — Anesthesia Postprocedure Evaluation (Signed)
Anesthesia Post Note  Patient: Shirley Fisher  Procedure(s) Performed: RIGHT SHOULDER LIPOMA EXCISION (Right)     Patient location during evaluation: PACU Anesthesia Type: MAC Level of consciousness: awake and alert Pain management: pain level controlled Vital Signs Assessment: post-procedure vital signs reviewed and stable Respiratory status: spontaneous breathing, nonlabored ventilation, respiratory function stable and patient connected to nasal cannula oxygen Cardiovascular status: stable and blood pressure returned to baseline Postop Assessment: no apparent nausea or vomiting Anesthetic complications: no   No notable events documented.  Last Vitals:  Vitals:   01/15/21 1120 01/15/21 1135  BP: 125/81 133/85  Pulse: 79 77  Resp: 18 13  Temp: 36.4 C (!) 36.3 C  SpO2: 100% 100%    Last Pain:  Vitals:   01/15/21 1135  TempSrc:   PainSc: 7                  [name] L Jovana Rembold

## 2021-01-15 NOTE — Discharge Instructions (Signed)
[place] Office Phone Number [phone]   POST OP INSTRUCTIONS Take 400 mg of ibuprofen every 8 hours or 650 mg tylenol every 6 hours for next 72 hours then as needed. Use ice several times daily also. Always review your discharge instruction sheet given to you by the facility where your surgery was performed.  IF YOU HAVE DISABILITY OR FAMILY LEAVE FORMS, YOU MUST BRING THEM TO THE OFFICE FOR PROCESSING.  DO NOT GIVE THEM TO YOUR DOCTOR.  you may take acetaminophen (Tylenol), naprosyn (Alleve) or ibuprofen (Advil) as needed. Take your usually prescribed medications unless otherwise directed You should eat very light the first 24 hours after surgery, such as soup, crackers, pudding, etc.  Resume your normal diet the day after surgery. Most patients will experience some swelling and bruising. Ice packs will help. Swelling and bruising can take several days to resolve.  It is common to experience some constipation if taking pain medication after surgery.  Increasing fluid intake and taking a stool softener will usually help or prevent this problem from occurring.  A mild laxative (Milk of Magnesia or Miralax) should be taken according to package directions if there are no bowel movements after 48 hours. Unless discharge instructions indicate otherwise, you may remove your bandages 48 hours after surgery and you may shower at that time.  You may have steri-strips (small skin tapes) in place directly over the incision.  These strips should be left on the skin for 7-10 days and will come off on their own.  If your surgeon used skin glue on the incision, you may shower in 24 hours.  The glue will flake off over the next 2-3 weeks.  Any sutures or staples will be removed at the office during your follow-up visit. ACTIVITIES:  You may resume regular daily activities (gradually increasing) beginning the next day.  Wearing a good support bra or sports bra minimizes pain and swelling.  You  may have sexual intercourse when it is comfortable. You may drive when you no longer are taking prescription pain medication, you can comfortably wear a seatbelt, and you can safely maneuver your car and apply brakes. RETURN TO WORK:  ______________________________________________________________________________________ Shirley Fisher should see your doctor in the office for a follow-up appointment approximately two weeks after your surgery.  Your doctors nurse will typically make your follow-up appointment when she calls you with your pathology report.  Expect your pathology report 3-4 business days after your surgery.  You may call to check if you do not hear from [place] after three days. OTHER INSTRUCTIONS: _______________________________________________________________________________________________ _____________________________________________________________________________________________________________________________________ _____________________________________________________________________________________________________________________________________ _____________________________________________________________________________________________________________________________________  WHEN TO CALL DR Yu Cragun: Fever over 101.0 Nausea and/or vomiting. Extreme swelling or bruising. Continued bleeding from incision. Increased pain, redness, or drainage from the incision.  The clinic staff is available to answer your questions during regular business hours.  Please dont hesitate to call and ask to speak to one of the nurses for clinical concerns.  If you have a medical emergency, go to the nearest emergency room or call 911.  A surgeon from [facility] is always on call at the hospital.  For further questions, please visit [email or web address]

## 2021-01-16 ENCOUNTER — Encounter (HOSPITAL_COMMUNITY): Payer: Self-pay | Admitting: General Surgery

## 2021-01-16 LAB — SURGICAL PATHOLOGY

## 2021-01-27 ENCOUNTER — Other Ambulatory Visit: Payer: Self-pay | Admitting: Internal Medicine

## 2021-01-27 DIAGNOSIS — R9389 Abnormal findings on diagnostic imaging of other specified body structures: Secondary | ICD-10-CM | POA: Diagnosis not present

## 2021-01-27 DIAGNOSIS — N8501 Benign endometrial hyperplasia: Secondary | ICD-10-CM | POA: Diagnosis not present

## 2021-01-27 DIAGNOSIS — N920 Excessive and frequent menstruation with regular cycle: Secondary | ICD-10-CM | POA: Diagnosis not present

## 2021-01-27 MED ORDER — DIVALPROEX SODIUM 250 MG PO DR TAB: 250.0000 mg | DELAYED_RELEASE_TABLET | Freq: Two times a day (BID) | ORAL | 1 refills | Status: DC

## 2021-01-29 ENCOUNTER — Other Ambulatory Visit: Payer: Self-pay | Admitting: Internal Medicine

## 2021-02-11 ENCOUNTER — Encounter: Payer: Self-pay | Admitting: Internal Medicine

## 2021-03-09 ENCOUNTER — Other Ambulatory Visit: Payer: Self-pay | Admitting: Internal Medicine

## 2021-03-09 MED ORDER — ZONISAMIDE 100 MG PO CAPS: 100.0000 mg | ORAL_CAPSULE | Freq: Every day | ORAL | 2 refills | Status: DC

## 2021-03-25 DIAGNOSIS — F331 Major depressive disorder, recurrent, moderate: Secondary | ICD-10-CM | POA: Diagnosis not present

## 2021-03-25 DIAGNOSIS — F411 Generalized anxiety disorder: Secondary | ICD-10-CM | POA: Diagnosis not present

## 2021-03-25 DIAGNOSIS — F4010 Social phobia, unspecified: Secondary | ICD-10-CM | POA: Diagnosis not present

## 2021-03-25 DIAGNOSIS — F445 Conversion disorder with seizures or convulsions: Secondary | ICD-10-CM | POA: Diagnosis not present

## 2021-05-14 DIAGNOSIS — N920 Excessive and frequent menstruation with regular cycle: Secondary | ICD-10-CM | POA: Diagnosis not present

## 2021-05-14 DIAGNOSIS — D251 Intramural leiomyoma of uterus: Secondary | ICD-10-CM | POA: Diagnosis not present

## 2021-05-28 ENCOUNTER — Other Ambulatory Visit: Payer: Self-pay | Admitting: Internal Medicine

## 2021-05-29 DIAGNOSIS — N809 Endometriosis, unspecified: Secondary | ICD-10-CM | POA: Diagnosis not present

## 2021-05-29 DIAGNOSIS — D252 Subserosal leiomyoma of uterus: Secondary | ICD-10-CM | POA: Diagnosis not present

## 2021-05-29 DIAGNOSIS — N72 Inflammatory disease of cervix uteri: Secondary | ICD-10-CM | POA: Diagnosis not present

## 2021-05-29 DIAGNOSIS — N888 Other specified noninflammatory disorders of cervix uteri: Secondary | ICD-10-CM | POA: Diagnosis not present

## 2021-05-29 DIAGNOSIS — N840 Polyp of corpus uteri: Secondary | ICD-10-CM | POA: Diagnosis not present

## 2021-05-29 DIAGNOSIS — D251 Intramural leiomyoma of uterus: Secondary | ICD-10-CM | POA: Diagnosis not present

## 2021-05-29 DIAGNOSIS — D259 Leiomyoma of uterus, unspecified: Secondary | ICD-10-CM | POA: Diagnosis not present

## 2021-05-29 DIAGNOSIS — N80102 Endometriosis of left ovary, unspecified depth: Secondary | ICD-10-CM | POA: Diagnosis not present

## 2021-05-29 DIAGNOSIS — N8000 Endometriosis of the uterus, unspecified: Secondary | ICD-10-CM | POA: Diagnosis not present

## 2021-05-29 DIAGNOSIS — N946 Dysmenorrhea, unspecified: Secondary | ICD-10-CM | POA: Diagnosis not present

## 2021-05-29 DIAGNOSIS — Z79899 Other long term (current) drug therapy: Secondary | ICD-10-CM | POA: Diagnosis not present

## 2021-05-29 DIAGNOSIS — N920 Excessive and frequent menstruation with regular cycle: Secondary | ICD-10-CM | POA: Diagnosis not present

## 2021-05-29 DIAGNOSIS — N80359 Endometriosis of pelvic sidewall, unspecified side, unspecified depth: Secondary | ICD-10-CM | POA: Diagnosis not present

## 2021-05-29 DIAGNOSIS — R9389 Abnormal findings on diagnostic imaging of other specified body structures: Secondary | ICD-10-CM | POA: Diagnosis not present

## 2021-05-29 DIAGNOSIS — I10 Essential (primary) hypertension: Secondary | ICD-10-CM | POA: Diagnosis not present

## 2021-06-01 HISTORY — PX: ABDOMINAL HYSTERECTOMY: SHX81

## 2021-06-16 ENCOUNTER — Other Ambulatory Visit: Payer: Self-pay | Admitting: Radiation Therapy

## 2021-07-02 ENCOUNTER — Ambulatory Visit (HOSPITAL_COMMUNITY)
Admission: RE | Admit: 2021-07-02 | Discharge: 2021-07-02 | Disposition: A | Discharge status: Home / Self Care | Payer: BC Managed Care – PPO | Source: Ambulatory Visit | Attending: Internal Medicine | Admitting: Internal Medicine

## 2021-07-02 DIAGNOSIS — C719 Malignant neoplasm of brain, unspecified: Secondary | ICD-10-CM | POA: Insufficient documentation

## 2021-07-02 DIAGNOSIS — Z9889 Other specified postprocedural states: Secondary | ICD-10-CM | POA: Diagnosis not present

## 2021-07-02 MED ORDER — GADOBUTROL 1 MMOL/ML IV SOLN
9.0000 mL | Freq: Once | INTRAVENOUS | Status: AC | PRN
  Administered 2021-07-02: 9 mL via INTRAVENOUS

## 2021-07-06 ENCOUNTER — Inpatient Hospital Stay: Payer: BC Managed Care – PPO | Attending: Internal Medicine

## 2021-07-06 DIAGNOSIS — Z803 Family history of malignant neoplasm of breast: Secondary | ICD-10-CM | POA: Insufficient documentation

## 2021-07-06 DIAGNOSIS — Z79899 Other long term (current) drug therapy: Secondary | ICD-10-CM | POA: Insufficient documentation

## 2021-07-06 DIAGNOSIS — C711 Malignant neoplasm of frontal lobe: Secondary | ICD-10-CM | POA: Insufficient documentation

## 2021-07-07 ENCOUNTER — Inpatient Hospital Stay: Payer: BC Managed Care – PPO | Admitting: Internal Medicine

## 2021-07-13 ENCOUNTER — Ambulatory Visit (HOSPITAL_COMMUNITY): Payer: BC Managed Care – PPO

## 2021-07-14 DIAGNOSIS — R0602 Shortness of breath: Secondary | ICD-10-CM | POA: Diagnosis not present

## 2021-07-14 DIAGNOSIS — Z1331 Encounter for screening for depression: Secondary | ICD-10-CM | POA: Diagnosis not present

## 2021-07-14 DIAGNOSIS — I10 Essential (primary) hypertension: Secondary | ICD-10-CM | POA: Diagnosis not present

## 2021-07-14 DIAGNOSIS — G40909 Epilepsy, unspecified, not intractable, without status epilepticus: Secondary | ICD-10-CM | POA: Diagnosis not present

## 2021-07-16 ENCOUNTER — Other Ambulatory Visit: Payer: Self-pay

## 2021-07-16 ENCOUNTER — Inpatient Hospital Stay (HOSPITAL_BASED_OUTPATIENT_CLINIC_OR_DEPARTMENT_OTHER): Payer: BC Managed Care – PPO | Admitting: Internal Medicine

## 2021-07-16 VITALS — BP 135/81 | HR 93 | Temp 97.8°F | Resp 17 | Ht 69.0 in | Wt 172.0 lb

## 2021-07-16 DIAGNOSIS — C719 Malignant neoplasm of brain, unspecified: Secondary | ICD-10-CM

## 2021-07-16 DIAGNOSIS — C711 Malignant neoplasm of frontal lobe: Secondary | ICD-10-CM | POA: Diagnosis not present

## 2021-07-16 DIAGNOSIS — R569 Unspecified convulsions: Secondary | ICD-10-CM

## 2021-07-16 DIAGNOSIS — Z803 Family history of malignant neoplasm of breast: Secondary | ICD-10-CM | POA: Diagnosis not present

## 2021-07-16 DIAGNOSIS — Z79899 Other long term (current) drug therapy: Secondary | ICD-10-CM | POA: Diagnosis not present
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
## 2021-07-29 DIAGNOSIS — F411 Generalized anxiety disorder: Secondary | ICD-10-CM | POA: Diagnosis not present

## 2021-07-29 DIAGNOSIS — F331 Major depressive disorder, recurrent, moderate: Secondary | ICD-10-CM | POA: Diagnosis not present

## 2021-07-29 DIAGNOSIS — F445 Conversion disorder with seizures or convulsions: Secondary | ICD-10-CM | POA: Diagnosis not present

## 2021-07-29 DIAGNOSIS — F4010 Social phobia, unspecified: Secondary | ICD-10-CM | POA: Diagnosis not present

## 2021-08-28 ENCOUNTER — Encounter: Payer: Self-pay | Admitting: Internal Medicine

## 2021-08-28 MED ORDER — ZONISAMIDE 100 MG PO CAPS: 100.0000 mg | ORAL_CAPSULE | Freq: Every day | ORAL | 2 refills | Status: DC

## 2021-09-18 ENCOUNTER — Other Ambulatory Visit: Payer: Self-pay | Admitting: Internal Medicine

## 2021-09-18 NOTE — Telephone Encounter (Signed)
Refilled per Dr. Renda Rolls OV note 07/16/21: Lamictal may continue at '200mg'$  BID

## 2021-10-11 DIAGNOSIS — Z20822 Contact with and (suspected) exposure to covid-19: Secondary | ICD-10-CM | POA: Diagnosis not present

## 2021-10-11 DIAGNOSIS — J019 Acute sinusitis, unspecified: Secondary | ICD-10-CM | POA: Diagnosis not present

## 2021-11-09 DIAGNOSIS — N941 Unspecified dyspareunia: Secondary | ICD-10-CM | POA: Diagnosis not present

## 2021-11-09 DIAGNOSIS — N898 Other specified noninflammatory disorders of vagina: Secondary | ICD-10-CM | POA: Diagnosis not present

## 2021-11-10 ENCOUNTER — Telehealth: Payer: Self-pay

## 2021-11-10 DIAGNOSIS — D1801 Hemangioma of skin and subcutaneous tissue: Secondary | ICD-10-CM | POA: Diagnosis not present

## 2021-11-10 DIAGNOSIS — L821 Other seborrheic keratosis: Secondary | ICD-10-CM | POA: Diagnosis not present

## 2021-11-10 DIAGNOSIS — L814 Other melanin hyperpigmentation: Secondary | ICD-10-CM | POA: Diagnosis not present

## 2021-11-10 DIAGNOSIS — L570 Actinic keratosis: Secondary | ICD-10-CM | POA: Diagnosis not present

## 2021-11-10 DIAGNOSIS — D485 Neoplasm of uncertain behavior of skin: Secondary | ICD-10-CM | POA: Diagnosis not present

## 2021-11-10 DIAGNOSIS — D2271 Melanocytic nevi of right lower limb, including hip: Secondary | ICD-10-CM | POA: Diagnosis not present

## 2021-11-10 DIAGNOSIS — D2262 Melanocytic nevi of left upper limb, including shoulder: Secondary | ICD-10-CM | POA: Diagnosis not present

## 2021-11-10 NOTE — Patient Outreach (Signed)
  Care Coordination   11/10/2021 Name: Shirley Fisher MRN: [id number] DOB: 12-24-1970   Care Coordination Outreach Attempts:  An unsuccessful telephone outreach was attempted today to offer the patient information about available care coordination services as a benefit of their health plan.   Follow Up Plan:  Additional outreach attempts will be made to offer the patient care coordination information and services.   Encounter Outcome:  No Answer  Care Coordination Interventions Activated:  No   Care Coordination Interventions:  No, not indicated    Tomasa Rand, RN, BSN, CEN [facility] [employer] [phone]

## 2021-11-16 ENCOUNTER — Telehealth: Payer: Self-pay

## 2021-11-16 NOTE — Patient Outreach (Signed)
  Care Coordination   11/16/2021 Name: Shirley Fisher MRN: [id number] DOB: Jan 10, 1971   Care Coordination Outreach Attempts:  A second unsuccessful outreach was attempted today to offer the patient with information about available care coordination services as a benefit of their health plan.     Follow Up Plan:  Additional outreach attempts will be made to offer the patient care coordination information and services.   Encounter Outcome:  No Answer  Care Coordination Interventions Activated:  No   Care Coordination Interventions:  No, not indicated    Tomasa Rand, RN, BSN, CEN [facility] [employer] [phone]

## 2021-11-30 ENCOUNTER — Telehealth: Payer: Self-pay

## 2021-11-30 NOTE — Patient Outreach (Signed)
  Care Coordination   11/30/2021 Name: Shirley Fisher MRN: [id number] DOB: 1970/10/30   Care Coordination Outreach Attempts:  A third unsuccessful outreach was attempted today to offer the patient with information about available care coordination services as a benefit of their health plan.   Follow Up Plan:  No further outreach attempts will be made at this time. We have been unable to contact the patient to offer or enroll patient in care coordination services  Encounter Outcome:  No Answer  Care Coordination Interventions Activated:  No   Care Coordination Interventions:  No, not indicated    Tomasa Rand, RN, BSN, CEN [place] Coordinator [phone]

## 2021-12-16 DIAGNOSIS — B0089 Other herpesviral infection: Secondary | ICD-10-CM | POA: Diagnosis not present

## 2021-12-16 DIAGNOSIS — D485 Neoplasm of uncertain behavior of skin: Secondary | ICD-10-CM | POA: Diagnosis not present

## 2021-12-16 DIAGNOSIS — L988 Other specified disorders of the skin and subcutaneous tissue: Secondary | ICD-10-CM | POA: Diagnosis not present

## 2021-12-17 DIAGNOSIS — N941 Unspecified dyspareunia: Secondary | ICD-10-CM | POA: Diagnosis not present

## 2022-01-13 ENCOUNTER — Other Ambulatory Visit: Payer: Self-pay | Admitting: Physician Assistant

## 2022-01-13 DIAGNOSIS — I10 Essential (primary) hypertension: Secondary | ICD-10-CM | POA: Diagnosis not present

## 2022-01-13 DIAGNOSIS — Z1231 Encounter for screening mammogram for malignant neoplasm of breast: Secondary | ICD-10-CM | POA: Diagnosis not present

## 2022-01-16 DIAGNOSIS — J029 Acute pharyngitis, unspecified: Secondary | ICD-10-CM | POA: Diagnosis not present

## 2022-01-16 DIAGNOSIS — R509 Fever, unspecified: Secondary | ICD-10-CM | POA: Diagnosis not present

## 2022-01-16 DIAGNOSIS — R000 Tachycardia, unspecified: Secondary | ICD-10-CM | POA: Diagnosis not present

## 2022-01-27 ENCOUNTER — Other Ambulatory Visit: Payer: Self-pay | Admitting: Internal Medicine

## 2022-01-28 ENCOUNTER — Other Ambulatory Visit: Payer: Self-pay | Admitting: Physician Assistant

## 2022-01-28 ENCOUNTER — Encounter: Payer: Self-pay | Admitting: Internal Medicine

## 2022-01-28 DIAGNOSIS — Z1231 Encounter for screening mammogram for malignant neoplasm of breast: Secondary | ICD-10-CM

## 2022-02-15 DIAGNOSIS — R748 Abnormal levels of other serum enzymes: Secondary | ICD-10-CM | POA: Diagnosis not present

## 2022-02-25 ENCOUNTER — Other Ambulatory Visit: Payer: Self-pay | Admitting: Internal Medicine

## 2022-02-27 ENCOUNTER — Encounter: Payer: Self-pay | Admitting: Internal Medicine

## 2022-03-01 MED ORDER — ZONISAMIDE 100 MG PO CAPS: ORAL_CAPSULE | ORAL | 2 refills | Status: DC

## 2022-03-23 ENCOUNTER — Ambulatory Visit
Admission: RE | Admit: 2022-03-23 | Discharge: 2022-03-23 | Disposition: A | Discharge status: Home / Self Care | Payer: Medicaid Other | Source: Ambulatory Visit

## 2022-03-23 DIAGNOSIS — Z1231 Encounter for screening mammogram for malignant neoplasm of breast: Secondary | ICD-10-CM

## 2022-03-29 ENCOUNTER — Other Ambulatory Visit: Payer: Self-pay | Admitting: Radiation Therapy

## 2022-04-08 ENCOUNTER — Ambulatory Visit (HOSPITAL_COMMUNITY)
Admission: RE | Admit: 2022-04-08 | Discharge: 2022-04-08 | Disposition: A | Discharge status: Home / Self Care | Payer: Medicaid Other | Source: Ambulatory Visit | Attending: Internal Medicine | Admitting: Internal Medicine

## 2022-04-08 ENCOUNTER — Other Ambulatory Visit: Payer: Self-pay | Admitting: Internal Medicine

## 2022-04-08 ENCOUNTER — Inpatient Hospital Stay (HOSPITAL_COMMUNITY): Admission: RE | Admit: 2022-04-08 | Payer: BC Managed Care – PPO | Source: Ambulatory Visit

## 2022-04-08 DIAGNOSIS — C719 Malignant neoplasm of brain, unspecified: Secondary | ICD-10-CM

## 2022-04-08 MED ORDER — GADOBUTROL 1 MMOL/ML IV SOLN
8.0000 mL | Freq: Once | INTRAVENOUS | Status: AC | PRN
  Administered 2022-04-08: 8 mL via INTRAVENOUS

## 2022-04-12 ENCOUNTER — Inpatient Hospital Stay: Payer: Medicaid Other | Attending: Internal Medicine

## 2022-04-12 DIAGNOSIS — Z79899 Other long term (current) drug therapy: Secondary | ICD-10-CM | POA: Insufficient documentation

## 2022-04-12 DIAGNOSIS — C711 Malignant neoplasm of frontal lobe: Secondary | ICD-10-CM | POA: Insufficient documentation

## 2022-04-15 ENCOUNTER — Other Ambulatory Visit: Payer: Self-pay | Admitting: Radiation Therapy

## 2022-04-15 ENCOUNTER — Inpatient Hospital Stay: Payer: Medicaid Other | Admitting: Internal Medicine

## 2022-04-15 VITALS — BP 144/83 | HR 80 | Temp 98.1°F | Resp 16 | Wt 150.1 lb

## 2022-04-15 DIAGNOSIS — C719 Malignant neoplasm of brain, unspecified: Secondary | ICD-10-CM | POA: Diagnosis not present

## 2022-04-15 DIAGNOSIS — C711 Malignant neoplasm of frontal lobe: Secondary | ICD-10-CM

## 2022-04-15 DIAGNOSIS — R569 Unspecified convulsions: Secondary | ICD-10-CM

## 2022-04-15 DIAGNOSIS — Z79899 Other long term (current) drug therapy: Secondary | ICD-10-CM | POA: Diagnosis not present
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
## 2022-04-20 ENCOUNTER — Telehealth: Payer: Self-pay | Admitting: Radiation Oncology

## 2022-04-20 ENCOUNTER — Ambulatory Visit: Payer: Medicaid Other

## 2022-04-20 ENCOUNTER — Ambulatory Visit
Admission: RE | Admit: 2022-04-20 | Discharge: 2022-04-20 | Disposition: A | Discharge status: Home / Self Care | Payer: Medicaid Other | Source: Ambulatory Visit | Attending: Radiation Oncology | Admitting: Radiation Oncology

## 2022-04-20 NOTE — Telephone Encounter (Signed)
3/19 @ 8:20 am Spoke to patient after sent [place].  She stated very sick with fever and diarrhea need to r/s consult from today.  Patient now r/s for 3/26 in Dr. Isidore Moos 9:00 am slot.
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
## 2022-04-20 NOTE — Telephone Encounter (Signed)
3/18
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
## 2022-04-27 ENCOUNTER — Other Ambulatory Visit (HOSPITAL_COMMUNITY): Payer: Self-pay

## 2022-04-27 ENCOUNTER — Ambulatory Visit
Admission: RE | Admit: 2022-04-27 | Discharge: 2022-04-27 | Disposition: A | Discharge status: Home / Self Care | Payer: Medicaid Other | Source: Ambulatory Visit | Attending: Radiation Oncology | Admitting: Radiation Oncology

## 2022-04-27 ENCOUNTER — Encounter: Payer: Self-pay | Admitting: Internal Medicine

## 2022-04-27 ENCOUNTER — Other Ambulatory Visit: Payer: Self-pay | Admitting: Internal Medicine

## 2022-04-27 ENCOUNTER — Telehealth: Payer: Self-pay | Admitting: Pharmacist

## 2022-04-27 ENCOUNTER — Other Ambulatory Visit: Payer: Self-pay

## 2022-04-27 ENCOUNTER — Telehealth: Payer: Self-pay | Admitting: Pharmacy Technician

## 2022-04-27 VITALS — BP 130/81 | HR 85 | Temp 97.9°F | Resp 18 | Ht 68.0 in | Wt 150.2 lb

## 2022-04-27 DIAGNOSIS — Z79899 Other long term (current) drug therapy: Secondary | ICD-10-CM | POA: Insufficient documentation

## 2022-04-27 DIAGNOSIS — R519 Headache, unspecified: Secondary | ICD-10-CM | POA: Insufficient documentation

## 2022-04-27 DIAGNOSIS — C719 Malignant neoplasm of brain, unspecified: Secondary | ICD-10-CM

## 2022-04-27 DIAGNOSIS — C711 Malignant neoplasm of frontal lobe: Secondary | ICD-10-CM | POA: Insufficient documentation

## 2022-04-27 MED ORDER — TEMOZOLOMIDE 140 MG PO CAPS
140.0000 mg | ORAL_CAPSULE | Freq: Every day | ORAL | 0 refills | Status: DC
  Filled 2022-04-27: qty 42, 42d supply, fill #0
  Filled 2022-05-10: qty 28, 28d supply, fill #0
  Filled 2022-06-04: qty 28, 28d supply, fill #1

## 2022-04-27 MED ORDER — ONDANSETRON HCL 8 MG PO TABS
8.0000 mg | ORAL_TABLET | Freq: Three times a day (TID) | ORAL | 1 refills | Status: DC | PRN
  Filled 2022-04-27 – 2022-05-12 (×4): qty 30, 10d supply, fill #0
  Filled 2022-11-16: qty 30, 10d supply, fill #1

## 2022-04-27 NOTE — Telephone Encounter (Signed)
Oral Oncology Patient Advocate Encounter  After completing a benefits investigation, prior authorization for Temozolomide is not required at this time through [facility] [employer].  Patient's copay is $4.     Lady Deutscher, CPhT-Adv Oncology Pharmacy Patient [place] Direct Number: [phone]  Fax: [phone]

## 2022-04-27 NOTE — Progress Notes (Signed)
START ON PATHWAY REGIMEN - Neuro   Temozolomide 75 mg/m2 PO Daily D1-42 + RT x 1 Cycle:   One cycle, concurrent with RT:     Temozolomide    Temozolomide 150/200 mg/m2 D1-5 q28 Days:   A cycle is every 28 days:     Temozolomide      Temozolomide   **Always confirm dose/schedule in your pharmacy ordering system**  Patient Characteristics: Glioma, Grade 2 Astrocytoma, IDH-mutant/Oligodendroglioma, IDH-mutant and 1p/19q-codeleted, Newly Diagnosed / Treatment Naive, High Risk Disease Classification: Glioma Disease Classification: Grade 2 Astrocytoma, IDH-mutant/Oligodendroglioma, IDH-mutant and 1p/19q-codeleted Disease Status: Newly Diagnosed / Treatment Naive Risk Status: High Risk Intent of Therapy: Non-Curative / Palliative Intent, Discussed with Patient

## 2022-04-28 NOTE — Telephone Encounter (Signed)
Oral Oncology Pharmacist Encounter  Received new prescription for Temodar (temozolomide) for the treatment of grade II glioma in conjunction with radiation, planned duration 42 days.  CBC and BMP from 01/09/23 assessed, no baseline dose adjustments required. Prescription dose and frequency assessed for appropriateness.  Current medication list in Epic reviewed, no relevant/significant DDIs with Temodar identified.  Evaluated chart and no patient barriers to medication adherence noted.   Patient agreement for treatment documented in MD note on 04/15/22.  Prescription has been e-scribed to the [facility] for benefits analysis and approval.  Oral Oncology Clinic will continue to follow for insurance authorization, copayment issues, initial counseling and start date.  Leron Croak, PharmD, BCPS, [facility] Hematology/Oncology Clinical Pharmacist [facility] [phone] 04/28/2022 8:35 AM

## 2022-05-04 ENCOUNTER — Other Ambulatory Visit: Payer: Self-pay

## 2022-05-10 ENCOUNTER — Other Ambulatory Visit (HOSPITAL_COMMUNITY): Payer: Self-pay

## 2022-05-10 NOTE — Telephone Encounter (Signed)
Oral Chemotherapy Pharmacist Encounter  I spoke with patient for overview of: Temodar for the treatment of grade II glioma in conjunction with radiation, planned duration concomitant phase 42 days of therapy.   Counseled patient on administration, dosing, side effects, monitoring, drug-food interactions, safe handling, storage, and disposal.  Patient will take Temodar 140mg  capsules, 1 capsule, 140 mg total daily dose, by mouth once daily, may take at bedtime and on an empty stomach to decrease nausea and vomiting.  Patient will take Temodar concurrent with radiation for 42 days straight.  Temodar start date: 05/18/22 PM and radiation start date: 05/19/22  Adverse effects include but are not limited to: nausea, vomiting, anorexia, GI upset, rash, drug fever, and fatigue.  Prophylactic Zofran will not be used at initiation of concurrent phase, but will be initiated if nausea develops despite Temodar administration on an empty stomach and at bedtime. If this occurs, patient will take Zofran 8 mg tablet, 1 tablet by mouth 30-60 min prior to Temodar dose to help decrease N/V   Reviewed with patient importance of keeping a medication schedule and plan for any missed doses. No barriers to medication adherence identified.  Medication reconciliation performed and medication/allergy list updated.  All questions answered.  Ms. Klinkhammer voiced understanding and appreciation.   Medication education handout and calendar placed in mail for patient. Patient knows to call the office with questions or concerns. Oral Chemotherapy Clinic phone number provided to patient.   Lenord Carbo, PharmD, BCPS, [facility] Hematology/Oncology Clinical Pharmacist [facility] Oral Chemotherapy Navigation Clinics [phone] 05/10/2022 3:29 PM

## 2022-05-11 ENCOUNTER — Encounter (HOSPITAL_COMMUNITY): Payer: Self-pay

## 2022-05-11 ENCOUNTER — Other Ambulatory Visit (HOSPITAL_COMMUNITY): Payer: Self-pay

## 2022-05-12 ENCOUNTER — Ambulatory Visit
Admission: RE | Admit: 2022-05-12 | Discharge: 2022-05-12 | Disposition: A | Discharge status: Home / Self Care | Payer: Medicaid Other | Source: Ambulatory Visit | Attending: Radiation Oncology | Admitting: Radiation Oncology

## 2022-05-12 ENCOUNTER — Other Ambulatory Visit: Payer: Self-pay

## 2022-05-12 ENCOUNTER — Other Ambulatory Visit (HOSPITAL_COMMUNITY): Payer: Self-pay

## 2022-05-12 DIAGNOSIS — Z510 Encounter for antineoplastic radiation therapy: Secondary | ICD-10-CM | POA: Diagnosis present

## 2022-05-12 DIAGNOSIS — C711 Malignant neoplasm of frontal lobe: Secondary | ICD-10-CM | POA: Diagnosis present

## 2022-05-14 ENCOUNTER — Other Ambulatory Visit: Payer: Self-pay

## 2022-05-18 ENCOUNTER — Telehealth: Payer: Self-pay | Admitting: Internal Medicine

## 2022-05-18 DIAGNOSIS — Z510 Encounter for antineoplastic radiation therapy: Secondary | ICD-10-CM | POA: Diagnosis not present

## 2022-05-18 NOTE — Telephone Encounter (Signed)
Scheduled per 04/16 in-basket message, patient has been called and patient was notified.

## 2022-05-20 ENCOUNTER — Other Ambulatory Visit: Payer: Self-pay

## 2022-05-20 ENCOUNTER — Ambulatory Visit
Admission: RE | Admit: 2022-05-20 | Discharge: 2022-05-20 | Disposition: A | Discharge status: Home / Self Care | Payer: Medicaid Other | Source: Ambulatory Visit | Attending: Radiation Oncology | Admitting: Radiation Oncology

## 2022-05-20 DIAGNOSIS — Z510 Encounter for antineoplastic radiation therapy: Secondary | ICD-10-CM | POA: Diagnosis not present

## 2022-05-20 LAB — RAD ONC ARIA SESSION SUMMARY
Course Elapsed Days: 0
Plan Fractions Treated to Date: 1
Plan Prescribed Dose Per Fraction: 1.8 Gy
Plan Total Fractions Prescribed: 30
Plan Total Prescribed Dose: 54 Gy
Reference Point Dosage Given to Date: 1.8 Gy
Reference Point Session Dosage Given: 1.8 Gy
Session Number: 1

## 2022-05-21 ENCOUNTER — Ambulatory Visit
Admission: RE | Admit: 2022-05-21 | Discharge: 2022-05-21 | Disposition: A | Discharge status: Home / Self Care | Payer: Medicaid Other | Source: Ambulatory Visit | Attending: Radiation Oncology | Admitting: Radiation Oncology

## 2022-05-21 ENCOUNTER — Other Ambulatory Visit: Payer: Self-pay

## 2022-05-21 DIAGNOSIS — Z510 Encounter for antineoplastic radiation therapy: Secondary | ICD-10-CM | POA: Diagnosis not present

## 2022-05-21 LAB — RAD ONC ARIA SESSION SUMMARY
Course Elapsed Days: 1
Plan Fractions Treated to Date: 2
Plan Prescribed Dose Per Fraction: 1.8 Gy
Plan Total Fractions Prescribed: 30
Plan Total Prescribed Dose: 54 Gy
Reference Point Dosage Given to Date: 3.6 Gy
Reference Point Session Dosage Given: 1.8 Gy
Session Number: 2

## 2022-05-24 ENCOUNTER — Ambulatory Visit
Admission: RE | Admit: 2022-05-24 | Discharge: 2022-05-24 | Disposition: A | Discharge status: Home / Self Care | Payer: Medicaid Other | Source: Ambulatory Visit | Attending: Radiation Oncology | Admitting: Radiation Oncology

## 2022-05-24 ENCOUNTER — Other Ambulatory Visit: Payer: Self-pay

## 2022-05-24 DIAGNOSIS — C711 Malignant neoplasm of frontal lobe: Secondary | ICD-10-CM

## 2022-05-24 DIAGNOSIS — Z510 Encounter for antineoplastic radiation therapy: Secondary | ICD-10-CM | POA: Diagnosis not present

## 2022-05-24 LAB — RAD ONC ARIA SESSION SUMMARY
Course Elapsed Days: 4
Plan Fractions Treated to Date: 3
Plan Prescribed Dose Per Fraction: 1.8 Gy
Plan Total Fractions Prescribed: 30
Plan Total Prescribed Dose: 54 Gy
Reference Point Dosage Given to Date: 5.4 Gy
Reference Point Session Dosage Given: 1.8 Gy
Session Number: 3

## 2022-05-24 MED ORDER — SONAFINE EX EMUL
1.0000 | Freq: Once | CUTANEOUS | Status: AC
  Administered 2022-05-24: 1 via TOPICAL

## 2022-05-24 NOTE — Progress Notes (Signed)
Pt here for patient teaching.  Pt given Radiation and You booklet, Managing Acute Radiation Side Effects for Head and Neck Cancer handout, skin care instructions, and Sonafine.  Reviewed areas of pertinence such as diarrhea, fatigue, hair loss, mouth changes, nausea and vomiting, skin changes, throat changes, headache, blurry vision, earaches, and taste changes . Pt able to give teach back of to pat skin, use unscented/gentle soap, and drink plenty of water,apply Sonafine bid, avoid applying anything to skin within 4 hours of treatment, and to use an electric razor if they must shave. Pt verbalizes understanding of information given and will contact nursing with any questions or concerns.     [email or web address]

## 2022-05-25 ENCOUNTER — Other Ambulatory Visit: Payer: Self-pay

## 2022-05-25 ENCOUNTER — Ambulatory Visit
Admission: RE | Admit: 2022-05-25 | Discharge: 2022-05-25 | Disposition: A | Discharge status: Home / Self Care | Payer: Medicaid Other | Source: Ambulatory Visit | Attending: Radiation Oncology | Admitting: Radiation Oncology

## 2022-05-25 DIAGNOSIS — Z510 Encounter for antineoplastic radiation therapy: Secondary | ICD-10-CM | POA: Diagnosis not present

## 2022-05-25 LAB — RAD ONC ARIA SESSION SUMMARY
Course Elapsed Days: 5
Plan Fractions Treated to Date: 4
Plan Prescribed Dose Per Fraction: 1.8 Gy
Plan Total Fractions Prescribed: 30
Plan Total Prescribed Dose: 54 Gy
Reference Point Dosage Given to Date: 7.2 Gy
Reference Point Session Dosage Given: 1.8 Gy
Session Number: 4

## 2022-05-26 ENCOUNTER — Other Ambulatory Visit: Payer: Self-pay

## 2022-05-26 ENCOUNTER — Ambulatory Visit
Admission: RE | Admit: 2022-05-26 | Discharge: 2022-05-26 | Disposition: A | Discharge status: Home / Self Care | Payer: Medicaid Other | Source: Ambulatory Visit | Attending: Radiation Oncology | Admitting: Radiation Oncology

## 2022-05-26 DIAGNOSIS — Z510 Encounter for antineoplastic radiation therapy: Secondary | ICD-10-CM | POA: Diagnosis not present

## 2022-05-26 LAB — RAD ONC ARIA SESSION SUMMARY
Course Elapsed Days: 6
Plan Fractions Treated to Date: 5
Plan Prescribed Dose Per Fraction: 1.8 Gy
Plan Total Fractions Prescribed: 30
Plan Total Prescribed Dose: 54 Gy
Reference Point Dosage Given to Date: 9 Gy
Reference Point Session Dosage Given: 1.8 Gy
Session Number: 5

## 2022-05-27 ENCOUNTER — Ambulatory Visit
Admission: RE | Admit: 2022-05-27 | Discharge: 2022-05-27 | Disposition: A | Discharge status: Home / Self Care | Payer: Medicaid Other | Source: Ambulatory Visit | Attending: Radiation Oncology | Admitting: Radiation Oncology

## 2022-05-27 ENCOUNTER — Other Ambulatory Visit: Payer: Self-pay

## 2022-05-27 DIAGNOSIS — Z510 Encounter for antineoplastic radiation therapy: Secondary | ICD-10-CM | POA: Diagnosis not present

## 2022-05-27 LAB — RAD ONC ARIA SESSION SUMMARY
Course Elapsed Days: 7
Plan Fractions Treated to Date: 6
Plan Prescribed Dose Per Fraction: 1.8 Gy
Plan Total Fractions Prescribed: 30
Plan Total Prescribed Dose: 54 Gy
Reference Point Dosage Given to Date: 10.8 Gy
Reference Point Session Dosage Given: 1.8 Gy
Session Number: 6

## 2022-05-28 ENCOUNTER — Other Ambulatory Visit: Payer: Self-pay

## 2022-05-28 ENCOUNTER — Ambulatory Visit
Admission: RE | Admit: 2022-05-28 | Discharge: 2022-05-28 | Disposition: A | Discharge status: Home / Self Care | Payer: Medicaid Other | Source: Ambulatory Visit | Attending: Radiation Oncology | Admitting: Radiation Oncology

## 2022-05-28 DIAGNOSIS — Z510 Encounter for antineoplastic radiation therapy: Secondary | ICD-10-CM | POA: Diagnosis not present

## 2022-05-28 LAB — RAD ONC ARIA SESSION SUMMARY
Course Elapsed Days: 8
Plan Fractions Treated to Date: 7
Plan Prescribed Dose Per Fraction: 1.8 Gy
Plan Total Fractions Prescribed: 30
Plan Total Prescribed Dose: 54 Gy
Reference Point Dosage Given to Date: 12.6 Gy
Reference Point Session Dosage Given: 1.8 Gy
Session Number: 7

## 2022-05-31 ENCOUNTER — Ambulatory Visit: Payer: Medicaid Other

## 2022-05-31 ENCOUNTER — Other Ambulatory Visit: Payer: Self-pay

## 2022-05-31 ENCOUNTER — Ambulatory Visit
Admission: RE | Admit: 2022-05-31 | Discharge: 2022-05-31 | Disposition: A | Discharge status: Home / Self Care | Payer: Medicaid Other | Source: Ambulatory Visit | Attending: Radiation Oncology | Admitting: Radiation Oncology

## 2022-05-31 DIAGNOSIS — Z510 Encounter for antineoplastic radiation therapy: Secondary | ICD-10-CM | POA: Diagnosis not present

## 2022-05-31 LAB — RAD ONC ARIA SESSION SUMMARY
Course Elapsed Days: 11
Plan Fractions Treated to Date: 8
Plan Prescribed Dose Per Fraction: 1.8 Gy
Plan Total Fractions Prescribed: 30
Plan Total Prescribed Dose: 54 Gy
Reference Point Dosage Given to Date: 14.4 Gy
Reference Point Session Dosage Given: 1.8 Gy
Session Number: 8

## 2022-06-01 ENCOUNTER — Other Ambulatory Visit: Payer: Self-pay

## 2022-06-01 ENCOUNTER — Telehealth: Payer: Self-pay | Admitting: *Deleted

## 2022-06-01 ENCOUNTER — Ambulatory Visit: Payer: Medicaid Other | Admitting: Internal Medicine

## 2022-06-01 ENCOUNTER — Other Ambulatory Visit: Payer: Medicaid Other

## 2022-06-01 ENCOUNTER — Ambulatory Visit
Admission: RE | Admit: 2022-06-01 | Discharge: 2022-06-01 | Disposition: A | Discharge status: Home / Self Care | Payer: Medicaid Other | Source: Ambulatory Visit | Attending: Radiation Oncology | Admitting: Radiation Oncology

## 2022-06-01 DIAGNOSIS — Z510 Encounter for antineoplastic radiation therapy: Secondary | ICD-10-CM | POA: Diagnosis not present

## 2022-06-01 LAB — RAD ONC ARIA SESSION SUMMARY
Course Elapsed Days: 12
Plan Fractions Treated to Date: 9
Plan Prescribed Dose Per Fraction: 1.8 Gy
Plan Total Fractions Prescribed: 30
Plan Total Prescribed Dose: 54 Gy
Reference Point Dosage Given to Date: 16.2 Gy
Reference Point Session Dosage Given: 1.8 Gy
Session Number: 9

## 2022-06-01 NOTE — Telephone Encounter (Signed)
PC to patient regarding missed lab & MD appointments this a.m., she states she was unaware, thought she only had radiation appointment today.  Informed patient our scheduling department will contact her to reschedule, she verbalizes understanding.  Scheduling message sent.

## 2022-06-02 ENCOUNTER — Other Ambulatory Visit: Payer: Self-pay

## 2022-06-02 ENCOUNTER — Ambulatory Visit
Admission: RE | Admit: 2022-06-02 | Discharge: 2022-06-02 | Disposition: A | Discharge status: Home / Self Care | Payer: Medicaid Other | Source: Ambulatory Visit | Attending: Radiation Oncology | Admitting: Radiation Oncology

## 2022-06-02 DIAGNOSIS — C711 Malignant neoplasm of frontal lobe: Secondary | ICD-10-CM | POA: Diagnosis present

## 2022-06-02 DIAGNOSIS — Z510 Encounter for antineoplastic radiation therapy: Secondary | ICD-10-CM | POA: Insufficient documentation

## 2022-06-02 LAB — RAD ONC ARIA SESSION SUMMARY
Course Elapsed Days: 13
Plan Fractions Treated to Date: 10
Plan Prescribed Dose Per Fraction: 1.8 Gy
Plan Total Fractions Prescribed: 30
Plan Total Prescribed Dose: 54 Gy
Reference Point Dosage Given to Date: 18 Gy
Reference Point Session Dosage Given: 1.8 Gy
Session Number: 10

## 2022-06-03 ENCOUNTER — Other Ambulatory Visit (HOSPITAL_COMMUNITY): Payer: Self-pay

## 2022-06-03 ENCOUNTER — Inpatient Hospital Stay: Payer: Medicaid Other

## 2022-06-03 ENCOUNTER — Other Ambulatory Visit: Payer: Self-pay

## 2022-06-03 ENCOUNTER — Ambulatory Visit
Admission: RE | Admit: 2022-06-03 | Discharge: 2022-06-03 | Disposition: A | Discharge status: Home / Self Care | Payer: Medicaid Other | Source: Ambulatory Visit | Attending: Radiation Oncology | Admitting: Radiation Oncology

## 2022-06-03 ENCOUNTER — Inpatient Hospital Stay (HOSPITAL_BASED_OUTPATIENT_CLINIC_OR_DEPARTMENT_OTHER): Payer: Medicaid Other | Admitting: Internal Medicine

## 2022-06-03 VITALS — BP 124/81 | HR 85 | Temp 98.3°F | Resp 18 | Wt 145.7 lb

## 2022-06-03 DIAGNOSIS — Z79899 Other long term (current) drug therapy: Secondary | ICD-10-CM | POA: Insufficient documentation

## 2022-06-03 DIAGNOSIS — Z510 Encounter for antineoplastic radiation therapy: Secondary | ICD-10-CM | POA: Diagnosis not present

## 2022-06-03 DIAGNOSIS — C719 Malignant neoplasm of brain, unspecified: Secondary | ICD-10-CM

## 2022-06-03 DIAGNOSIS — C711 Malignant neoplasm of frontal lobe: Secondary | ICD-10-CM | POA: Insufficient documentation

## 2022-06-03 DIAGNOSIS — R569 Unspecified convulsions: Secondary | ICD-10-CM

## 2022-06-03 DIAGNOSIS — Z803 Family history of malignant neoplasm of breast: Secondary | ICD-10-CM | POA: Insufficient documentation

## 2022-06-03 LAB — CMP (CANCER CENTER ONLY)
ALT: 15 U/L (ref 0–44)
AST: 19 U/L (ref 15–41)
Albumin: 4.3 g/dL (ref 3.5–5.0)
Alkaline Phosphatase: 112 U/L (ref 38–126)
Anion gap: 5 (ref 5–15)
BUN: 13 mg/dL (ref 6–20)
CO2: 29 mmol/L (ref 22–32)
Calcium: 9.2 mg/dL (ref 8.9–10.3)
Chloride: 108 mmol/L (ref 98–111)
Creatinine: 0.73 mg/dL (ref 0.44–1.00)
GFR, Estimated: >60 mL/min (ref >60)
Glucose, Bld: 76 mg/dL (ref 70–99)
Potassium: 4.1 mmol/L (ref 3.5–5.1)
Sodium: 142 mmol/L (ref 135–145)
Total Bilirubin: 0.4 mg/dL (ref 0.3–1.2)
Total Protein: 6.8 g/dL (ref 6.5–8.1)

## 2022-06-03 LAB — RAD ONC ARIA SESSION SUMMARY
Course Elapsed Days: 14
Plan Fractions Treated to Date: 11
Plan Prescribed Dose Per Fraction: 1.8 Gy
Plan Total Fractions Prescribed: 30
Plan Total Prescribed Dose: 54 Gy
Reference Point Dosage Given to Date: 19.8 Gy
Reference Point Session Dosage Given: 1.8 Gy
Session Number: 11

## 2022-06-03 LAB — CBC WITH DIFFERENTIAL (CANCER CENTER ONLY)
Abs Immature Granulocytes: 0.04 10*3/uL (ref 0.00–0.07)
Basophils Absolute: 0.1 10*3/uL (ref 0.0–0.1)
Basophils Relative: 1 %
Eosinophils Absolute: 0.5 10*3/uL (ref 0.0–0.5)
Eosinophils Relative: 6 %
HCT: 35.8 % — ABNORMAL LOW (ref 36.0–46.0)
Hemoglobin: 12 g/dL (ref 12.0–15.0)
Immature Granulocytes: 1 %
Lymphocytes Relative: 13 %
Lymphs Abs: 1 10*3/uL (ref 0.7–4.0)
MCH: 31.3 pg (ref 26.0–34.0)
MCHC: 33.5 g/dL (ref 30.0–36.0)
MCV: 93.5 fL (ref 80.0–100.0)
Monocytes Absolute: 0.9 10*3/uL (ref 0.1–1.0)
Monocytes Relative: 10 %
Neutro Abs: 5.8 10*3/uL (ref 1.7–7.7)
Neutrophils Relative %: 69 %
Platelet Count: 350 10*3/uL (ref 150–400)
RBC: 3.83 MIL/uL — ABNORMAL LOW (ref 3.87–5.11)
RDW: 13.3 % (ref 11.5–15.5)
WBC Count: 8.2 10*3/uL (ref 4.0–10.5)
nRBC: 0 % (ref 0.0–0.2)
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
## 2022-06-04 ENCOUNTER — Ambulatory Visit
Admission: RE | Admit: 2022-06-04 | Discharge: 2022-06-04 | Disposition: A | Discharge status: Home / Self Care | Payer: Medicaid Other | Source: Ambulatory Visit | Attending: Radiation Oncology | Admitting: Radiation Oncology

## 2022-06-04 ENCOUNTER — Other Ambulatory Visit: Payer: Self-pay

## 2022-06-04 ENCOUNTER — Other Ambulatory Visit: Payer: Self-pay | Admitting: Internal Medicine

## 2022-06-04 ENCOUNTER — Other Ambulatory Visit (HOSPITAL_COMMUNITY): Payer: Self-pay

## 2022-06-04 DIAGNOSIS — Z510 Encounter for antineoplastic radiation therapy: Secondary | ICD-10-CM | POA: Diagnosis not present

## 2022-06-04 DIAGNOSIS — C719 Malignant neoplasm of brain, unspecified: Secondary | ICD-10-CM

## 2022-06-04 LAB — RAD ONC ARIA SESSION SUMMARY
Course Elapsed Days: 15
Plan Fractions Treated to Date: 12
Plan Prescribed Dose Per Fraction: 1.8 Gy
Plan Total Fractions Prescribed: 30
Plan Total Prescribed Dose: 54 Gy
Reference Point Dosage Given to Date: 21.6 Gy
Reference Point Session Dosage Given: 1.8 Gy
Session Number: 12

## 2022-06-04 MED ORDER — TEMOZOLOMIDE 140 MG PO CAPS
140.0000 mg | ORAL_CAPSULE | Freq: Every day | ORAL | 0 refills | Status: DC
Start: 2022-06-04 — End: 2023-05-16
  Filled 2022-06-04: qty 42, 34d supply, fill #0

## 2022-06-07 ENCOUNTER — Other Ambulatory Visit: Payer: Self-pay

## 2022-06-07 ENCOUNTER — Ambulatory Visit: Payer: Medicaid Other

## 2022-06-07 ENCOUNTER — Ambulatory Visit
Admission: RE | Admit: 2022-06-07 | Discharge: 2022-06-07 | Disposition: A | Discharge status: Home / Self Care | Payer: Medicaid Other | Source: Ambulatory Visit | Attending: Radiation Oncology | Admitting: Radiation Oncology

## 2022-06-07 ENCOUNTER — Other Ambulatory Visit (HOSPITAL_COMMUNITY): Payer: Self-pay

## 2022-06-07 DIAGNOSIS — Z510 Encounter for antineoplastic radiation therapy: Secondary | ICD-10-CM | POA: Diagnosis not present

## 2022-06-07 LAB — RAD ONC ARIA SESSION SUMMARY
Course Elapsed Days: 18
Plan Fractions Treated to Date: 13
Plan Prescribed Dose Per Fraction: 1.8 Gy
Plan Total Fractions Prescribed: 30
Plan Total Prescribed Dose: 54 Gy
Reference Point Dosage Given to Date: 23.4 Gy
Reference Point Session Dosage Given: 1.8 Gy
Session Number: 13

## 2022-06-08 ENCOUNTER — Other Ambulatory Visit: Payer: Self-pay

## 2022-06-08 ENCOUNTER — Ambulatory Visit
Admission: RE | Admit: 2022-06-08 | Discharge: 2022-06-08 | Disposition: A | Discharge status: Home / Self Care | Payer: Medicaid Other | Source: Ambulatory Visit | Attending: Radiation Oncology | Admitting: Radiation Oncology

## 2022-06-08 ENCOUNTER — Other Ambulatory Visit (HOSPITAL_BASED_OUTPATIENT_CLINIC_OR_DEPARTMENT_OTHER): Payer: Self-pay

## 2022-06-08 ENCOUNTER — Other Ambulatory Visit (HOSPITAL_COMMUNITY): Payer: Self-pay

## 2022-06-08 DIAGNOSIS — Z510 Encounter for antineoplastic radiation therapy: Secondary | ICD-10-CM | POA: Diagnosis not present

## 2022-06-08 LAB — RAD ONC ARIA SESSION SUMMARY
Course Elapsed Days: 19
Plan Fractions Treated to Date: 14
Plan Prescribed Dose Per Fraction: 1.8 Gy
Plan Total Fractions Prescribed: 30
Plan Total Prescribed Dose: 54 Gy
Reference Point Dosage Given to Date: 25.2 Gy
Reference Point Session Dosage Given: 1.8 Gy
Session Number: 14

## 2022-06-09 ENCOUNTER — Ambulatory Visit
Admission: RE | Admit: 2022-06-09 | Discharge: 2022-06-09 | Disposition: A | Discharge status: Home / Self Care | Payer: Medicaid Other | Source: Ambulatory Visit | Attending: Radiation Oncology | Admitting: Radiation Oncology

## 2022-06-09 ENCOUNTER — Other Ambulatory Visit: Payer: Self-pay

## 2022-06-09 DIAGNOSIS — Z510 Encounter for antineoplastic radiation therapy: Secondary | ICD-10-CM | POA: Diagnosis not present

## 2022-06-09 LAB — RAD ONC ARIA SESSION SUMMARY
Course Elapsed Days: 20
Plan Fractions Treated to Date: 15
Plan Prescribed Dose Per Fraction: 1.8 Gy
Plan Total Fractions Prescribed: 30
Plan Total Prescribed Dose: 54 Gy
Reference Point Dosage Given to Date: 27 Gy
Reference Point Session Dosage Given: 1.8 Gy
Session Number: 15

## 2022-06-10 ENCOUNTER — Ambulatory Visit
Admission: RE | Admit: 2022-06-10 | Discharge: 2022-06-10 | Disposition: A | Discharge status: Home / Self Care | Payer: Medicaid Other | Source: Ambulatory Visit | Attending: Radiation Oncology | Admitting: Radiation Oncology

## 2022-06-10 ENCOUNTER — Other Ambulatory Visit: Payer: Self-pay

## 2022-06-10 DIAGNOSIS — Z510 Encounter for antineoplastic radiation therapy: Secondary | ICD-10-CM | POA: Diagnosis not present

## 2022-06-10 LAB — RAD ONC ARIA SESSION SUMMARY
Course Elapsed Days: 21
Plan Fractions Treated to Date: 16
Plan Prescribed Dose Per Fraction: 1.8 Gy
Plan Total Fractions Prescribed: 30
Plan Total Prescribed Dose: 54 Gy
Reference Point Dosage Given to Date: 28.8 Gy
Reference Point Session Dosage Given: 1.8 Gy
Session Number: 16

## 2022-06-11 ENCOUNTER — Ambulatory Visit
Admission: RE | Admit: 2022-06-11 | Discharge: 2022-06-11 | Disposition: A | Discharge status: Home / Self Care | Payer: Medicaid Other | Source: Ambulatory Visit | Attending: Radiation Oncology | Admitting: Radiation Oncology

## 2022-06-11 ENCOUNTER — Other Ambulatory Visit: Payer: Self-pay

## 2022-06-11 ENCOUNTER — Encounter: Payer: Self-pay | Admitting: Internal Medicine

## 2022-06-11 ENCOUNTER — Other Ambulatory Visit (HOSPITAL_COMMUNITY): Payer: Self-pay

## 2022-06-11 DIAGNOSIS — Z510 Encounter for antineoplastic radiation therapy: Secondary | ICD-10-CM | POA: Diagnosis not present

## 2022-06-11 LAB — RAD ONC ARIA SESSION SUMMARY
Course Elapsed Days: 22
Plan Fractions Treated to Date: 17
Plan Prescribed Dose Per Fraction: 1.8 Gy
Plan Total Fractions Prescribed: 30
Plan Total Prescribed Dose: 54 Gy
Reference Point Dosage Given to Date: 30.6 Gy
Reference Point Session Dosage Given: 1.8 Gy
Session Number: 17

## 2022-06-14 ENCOUNTER — Ambulatory Visit
Admission: RE | Admit: 2022-06-14 | Discharge: 2022-06-14 | Disposition: A | Discharge status: Home / Self Care | Payer: Medicaid Other | Source: Ambulatory Visit | Attending: Radiation Oncology | Admitting: Radiation Oncology

## 2022-06-14 ENCOUNTER — Ambulatory Visit: Payer: Medicaid Other

## 2022-06-14 ENCOUNTER — Other Ambulatory Visit: Payer: Self-pay

## 2022-06-14 DIAGNOSIS — Z510 Encounter for antineoplastic radiation therapy: Secondary | ICD-10-CM | POA: Diagnosis not present

## 2022-06-14 LAB — RAD ONC ARIA SESSION SUMMARY
Course Elapsed Days: 25
Plan Fractions Treated to Date: 18
Plan Prescribed Dose Per Fraction: 1.8 Gy
Plan Total Fractions Prescribed: 30
Plan Total Prescribed Dose: 54 Gy
Reference Point Dosage Given to Date: 32.4 Gy
Reference Point Session Dosage Given: 1.8 Gy
Session Number: 18

## 2022-06-15 ENCOUNTER — Ambulatory Visit
Admission: RE | Admit: 2022-06-15 | Discharge: 2022-06-15 | Disposition: A | Discharge status: Home / Self Care | Payer: Medicaid Other | Source: Ambulatory Visit | Attending: Radiation Oncology | Admitting: Radiation Oncology

## 2022-06-15 ENCOUNTER — Inpatient Hospital Stay (HOSPITAL_BASED_OUTPATIENT_CLINIC_OR_DEPARTMENT_OTHER): Payer: Medicaid Other | Admitting: Internal Medicine

## 2022-06-15 ENCOUNTER — Inpatient Hospital Stay: Payer: Medicaid Other

## 2022-06-15 ENCOUNTER — Other Ambulatory Visit: Payer: Self-pay

## 2022-06-15 VITALS — BP 117/78 | HR 88 | Temp 98.1°F | Resp 17 | Ht 68.0 in | Wt 145.0 lb

## 2022-06-15 DIAGNOSIS — C719 Malignant neoplasm of brain, unspecified: Secondary | ICD-10-CM

## 2022-06-15 DIAGNOSIS — R569 Unspecified convulsions: Secondary | ICD-10-CM

## 2022-06-15 DIAGNOSIS — Z510 Encounter for antineoplastic radiation therapy: Secondary | ICD-10-CM | POA: Diagnosis not present

## 2022-06-15 LAB — RAD ONC ARIA SESSION SUMMARY
Course Elapsed Days: 26
Plan Fractions Treated to Date: 19
Plan Prescribed Dose Per Fraction: 1.8 Gy
Plan Total Fractions Prescribed: 30
Plan Total Prescribed Dose: 54 Gy
Reference Point Dosage Given to Date: 34.2 Gy
Reference Point Session Dosage Given: 1.8 Gy
Session Number: 19

## 2022-06-15 LAB — CMP (CANCER CENTER ONLY)
ALT: 20 U/L (ref 0–44)
AST: 18 U/L (ref 15–41)
Albumin: 4.6 g/dL (ref 3.5–5.0)
Alkaline Phosphatase: 149 U/L — ABNORMAL HIGH (ref 38–126)
Anion gap: 8 (ref 5–15)
BUN: 12 mg/dL (ref 6–20)
CO2: 28 mmol/L (ref 22–32)
Calcium: 9.6 mg/dL (ref 8.9–10.3)
Chloride: 107 mmol/L (ref 98–111)
Creatinine: 0.76 mg/dL (ref 0.44–1.00)
GFR, Estimated: >60 mL/min (ref >60)
Glucose, Bld: 62 mg/dL — ABNORMAL LOW (ref 70–99)
Potassium: 3.7 mmol/L (ref 3.5–5.1)
Sodium: 143 mmol/L (ref 135–145)
Total Bilirubin: 0.3 mg/dL (ref 0.3–1.2)
Total Protein: 7.4 g/dL (ref 6.5–8.1)

## 2022-06-15 LAB — CBC WITH DIFFERENTIAL (CANCER CENTER ONLY)
Abs Immature Granulocytes: 0.02 10*3/uL (ref 0.00–0.07)
Basophils Absolute: 0.1 10*3/uL (ref 0.0–0.1)
Basophils Relative: 2 %
Eosinophils Absolute: 0.5 10*3/uL (ref 0.0–0.5)
Eosinophils Relative: 10 %
HCT: 38.2 % (ref 36.0–46.0)
Hemoglobin: 12.5 g/dL (ref 12.0–15.0)
Immature Granulocytes: 0 %
Lymphocytes Relative: 19 %
Lymphs Abs: 0.9 10*3/uL (ref 0.7–4.0)
MCH: 30.6 pg (ref 26.0–34.0)
MCHC: 32.7 g/dL (ref 30.0–36.0)
MCV: 93.4 fL (ref 80.0–100.0)
Monocytes Absolute: 0.6 10*3/uL (ref 0.1–1.0)
Monocytes Relative: 12 %
Neutro Abs: 2.9 10*3/uL (ref 1.7–7.7)
Neutrophils Relative %: 57 %
Platelet Count: 409 10*3/uL — ABNORMAL HIGH (ref 150–400)
RBC: 4.09 MIL/uL (ref 3.87–5.11)
RDW: 13.5 % (ref 11.5–15.5)
WBC Count: 5 10*3/uL (ref 4.0–10.5)
nRBC: 0 % (ref 0.0–0.2)
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
## 2022-06-16 ENCOUNTER — Ambulatory Visit
Admission: RE | Admit: 2022-06-16 | Discharge: 2022-06-16 | Disposition: A | Discharge status: Home / Self Care | Payer: Medicaid Other | Source: Ambulatory Visit | Attending: Radiation Oncology | Admitting: Radiation Oncology

## 2022-06-16 ENCOUNTER — Other Ambulatory Visit: Payer: Self-pay

## 2022-06-16 DIAGNOSIS — Z510 Encounter for antineoplastic radiation therapy: Secondary | ICD-10-CM | POA: Diagnosis not present

## 2022-06-16 LAB — RAD ONC ARIA SESSION SUMMARY
Course Elapsed Days: 27
Plan Fractions Treated to Date: 20
Plan Prescribed Dose Per Fraction: 1.8 Gy
Plan Total Fractions Prescribed: 30
Plan Total Prescribed Dose: 54 Gy
Reference Point Dosage Given to Date: 36 Gy
Reference Point Session Dosage Given: 1.8 Gy
Session Number: 20

## 2022-06-17 ENCOUNTER — Other Ambulatory Visit: Payer: Self-pay

## 2022-06-17 ENCOUNTER — Ambulatory Visit
Admission: RE | Admit: 2022-06-17 | Discharge: 2022-06-17 | Disposition: A | Discharge status: Home / Self Care | Payer: Medicaid Other | Source: Ambulatory Visit | Attending: Radiation Oncology | Admitting: Radiation Oncology

## 2022-06-17 DIAGNOSIS — Z510 Encounter for antineoplastic radiation therapy: Secondary | ICD-10-CM | POA: Diagnosis not present

## 2022-06-17 LAB — RAD ONC ARIA SESSION SUMMARY
Course Elapsed Days: 28
Plan Fractions Treated to Date: 21
Plan Prescribed Dose Per Fraction: 1.8 Gy
Plan Total Fractions Prescribed: 30
Plan Total Prescribed Dose: 54 Gy
Reference Point Dosage Given to Date: 37.8 Gy
Reference Point Session Dosage Given: 1.8 Gy
Session Number: 21

## 2022-06-18 ENCOUNTER — Other Ambulatory Visit: Payer: Self-pay

## 2022-06-18 ENCOUNTER — Ambulatory Visit
Admission: RE | Admit: 2022-06-18 | Discharge: 2022-06-18 | Disposition: A | Discharge status: Home / Self Care | Payer: Medicaid Other | Source: Ambulatory Visit | Attending: Radiation Oncology | Admitting: Radiation Oncology

## 2022-06-18 DIAGNOSIS — Z510 Encounter for antineoplastic radiation therapy: Secondary | ICD-10-CM | POA: Diagnosis not present

## 2022-06-18 LAB — RAD ONC ARIA SESSION SUMMARY
Course Elapsed Days: 29
Plan Fractions Treated to Date: 22
Plan Prescribed Dose Per Fraction: 1.8 Gy
Plan Total Fractions Prescribed: 30
Plan Total Prescribed Dose: 54 Gy
Reference Point Dosage Given to Date: 39.6 Gy
Reference Point Session Dosage Given: 1.8 Gy
Session Number: 22

## 2022-06-21 ENCOUNTER — Ambulatory Visit
Admission: RE | Admit: 2022-06-21 | Discharge: 2022-06-21 | Disposition: A | Discharge status: Home / Self Care | Payer: Medicaid Other | Source: Ambulatory Visit | Attending: Radiation Oncology | Admitting: Radiation Oncology

## 2022-06-21 ENCOUNTER — Other Ambulatory Visit: Payer: Self-pay

## 2022-06-21 DIAGNOSIS — Z510 Encounter for antineoplastic radiation therapy: Secondary | ICD-10-CM | POA: Diagnosis not present

## 2022-06-21 LAB — RAD ONC ARIA SESSION SUMMARY
Course Elapsed Days: 32
Plan Fractions Treated to Date: 23
Plan Prescribed Dose Per Fraction: 1.8 Gy
Plan Total Fractions Prescribed: 30
Plan Total Prescribed Dose: 54 Gy
Reference Point Dosage Given to Date: 41.4 Gy
Reference Point Session Dosage Given: 1.8 Gy
Session Number: 23

## 2022-06-22 ENCOUNTER — Ambulatory Visit
Admission: RE | Admit: 2022-06-22 | Discharge: 2022-06-22 | Disposition: A | Discharge status: Home / Self Care | Payer: Medicaid Other | Source: Ambulatory Visit | Attending: Radiation Oncology | Admitting: Radiation Oncology

## 2022-06-22 ENCOUNTER — Other Ambulatory Visit: Payer: Self-pay

## 2022-06-22 DIAGNOSIS — Z510 Encounter for antineoplastic radiation therapy: Secondary | ICD-10-CM | POA: Diagnosis not present

## 2022-06-22 LAB — RAD ONC ARIA SESSION SUMMARY
Course Elapsed Days: 33
Plan Fractions Treated to Date: 24
Plan Prescribed Dose Per Fraction: 1.8 Gy
Plan Total Fractions Prescribed: 30
Plan Total Prescribed Dose: 54 Gy
Reference Point Dosage Given to Date: 43.2 Gy
Reference Point Session Dosage Given: 1.8 Gy
Session Number: 24

## 2022-06-23 ENCOUNTER — Ambulatory Visit
Admission: RE | Admit: 2022-06-23 | Discharge: 2022-06-23 | Disposition: A | Discharge status: Home / Self Care | Payer: Medicaid Other | Source: Ambulatory Visit | Attending: Radiation Oncology | Admitting: Radiation Oncology

## 2022-06-23 ENCOUNTER — Other Ambulatory Visit: Payer: Self-pay

## 2022-06-23 DIAGNOSIS — Z510 Encounter for antineoplastic radiation therapy: Secondary | ICD-10-CM | POA: Diagnosis not present

## 2022-06-23 LAB — RAD ONC ARIA SESSION SUMMARY
Course Elapsed Days: 34
Plan Fractions Treated to Date: 25
Plan Prescribed Dose Per Fraction: 1.8 Gy
Plan Total Fractions Prescribed: 30
Plan Total Prescribed Dose: 54 Gy
Reference Point Dosage Given to Date: 45 Gy
Reference Point Session Dosage Given: 1.8 Gy
Session Number: 25

## 2022-06-24 ENCOUNTER — Ambulatory Visit
Admission: RE | Admit: 2022-06-24 | Discharge: 2022-06-24 | Disposition: A | Discharge status: Home / Self Care | Payer: Medicaid Other | Source: Ambulatory Visit | Attending: Radiation Oncology | Admitting: Radiation Oncology

## 2022-06-24 ENCOUNTER — Other Ambulatory Visit: Payer: Self-pay

## 2022-06-24 DIAGNOSIS — Z510 Encounter for antineoplastic radiation therapy: Secondary | ICD-10-CM | POA: Diagnosis not present

## 2022-06-24 LAB — RAD ONC ARIA SESSION SUMMARY
Course Elapsed Days: 35
Plan Fractions Treated to Date: 26
Plan Prescribed Dose Per Fraction: 1.8 Gy
Plan Total Fractions Prescribed: 30
Plan Total Prescribed Dose: 54 Gy
Reference Point Dosage Given to Date: 46.8 Gy
Reference Point Session Dosage Given: 1.8 Gy
Session Number: 26

## 2022-06-25 ENCOUNTER — Other Ambulatory Visit: Payer: Self-pay

## 2022-06-25 ENCOUNTER — Ambulatory Visit
Admission: RE | Admit: 2022-06-25 | Discharge: 2022-06-25 | Disposition: A | Discharge status: Home / Self Care | Payer: Medicaid Other | Source: Ambulatory Visit | Attending: Radiation Oncology | Admitting: Radiation Oncology

## 2022-06-25 DIAGNOSIS — Z510 Encounter for antineoplastic radiation therapy: Secondary | ICD-10-CM | POA: Diagnosis not present

## 2022-06-25 LAB — RAD ONC ARIA SESSION SUMMARY
Course Elapsed Days: 36
Plan Fractions Treated to Date: 27
Plan Prescribed Dose Per Fraction: 1.8 Gy
Plan Total Fractions Prescribed: 30
Plan Total Prescribed Dose: 54 Gy
Reference Point Dosage Given to Date: 48.6 Gy
Reference Point Session Dosage Given: 1.8 Gy
Session Number: 27

## 2022-06-26 ENCOUNTER — Other Ambulatory Visit: Payer: Self-pay

## 2022-06-29 ENCOUNTER — Inpatient Hospital Stay: Payer: Medicaid Other

## 2022-06-29 ENCOUNTER — Ambulatory Visit
Admission: RE | Admit: 2022-06-29 | Discharge: 2022-06-29 | Disposition: A | Discharge status: Home / Self Care | Payer: Medicaid Other | Source: Ambulatory Visit | Attending: Radiation Oncology | Admitting: Radiation Oncology

## 2022-06-29 ENCOUNTER — Inpatient Hospital Stay (HOSPITAL_BASED_OUTPATIENT_CLINIC_OR_DEPARTMENT_OTHER): Payer: Medicaid Other | Admitting: Internal Medicine

## 2022-06-29 ENCOUNTER — Other Ambulatory Visit: Payer: Self-pay

## 2022-06-29 VITALS — BP 121/99 | HR 76 | Temp 98.1°F | Resp 17 | Wt 145.3 lb

## 2022-06-29 DIAGNOSIS — C719 Malignant neoplasm of brain, unspecified: Secondary | ICD-10-CM | POA: Diagnosis not present

## 2022-06-29 DIAGNOSIS — R569 Unspecified convulsions: Secondary | ICD-10-CM | POA: Diagnosis not present

## 2022-06-29 DIAGNOSIS — Z510 Encounter for antineoplastic radiation therapy: Secondary | ICD-10-CM | POA: Diagnosis not present

## 2022-06-29 LAB — CMP (CANCER CENTER ONLY)
ALT: 14 U/L (ref 0–44)
AST: 17 U/L (ref 15–41)
Albumin: 4.5 g/dL (ref 3.5–5.0)
Alkaline Phosphatase: 115 U/L (ref 38–126)
Anion gap: 5 (ref 5–15)
BUN: 14 mg/dL (ref 6–20)
CO2: 28 mmol/L (ref 22–32)
Calcium: 9.3 mg/dL (ref 8.9–10.3)
Chloride: 107 mmol/L (ref 98–111)
Creatinine: 0.64 mg/dL (ref 0.44–1.00)
GFR, Estimated: >60 mL/min (ref >60)
Glucose, Bld: 89 mg/dL (ref 70–99)
Potassium: 4.2 mmol/L (ref 3.5–5.1)
Sodium: 140 mmol/L (ref 135–145)
Total Bilirubin: 0.3 mg/dL (ref 0.3–1.2)
Total Protein: 6.9 g/dL (ref 6.5–8.1)

## 2022-06-29 LAB — CBC WITH DIFFERENTIAL (CANCER CENTER ONLY)
Abs Immature Granulocytes: 0.01 10*3/uL (ref 0.00–0.07)
Basophils Absolute: 0.1 10*3/uL (ref 0.0–0.1)
Basophils Relative: 1 %
Eosinophils Absolute: 0.2 10*3/uL (ref 0.0–0.5)
Eosinophils Relative: 4 %
HCT: 35.6 % — ABNORMAL LOW (ref 36.0–46.0)
Hemoglobin: 11.9 g/dL — ABNORMAL LOW (ref 12.0–15.0)
Immature Granulocytes: 0 %
Lymphocytes Relative: 15 %
Lymphs Abs: 0.8 10*3/uL (ref 0.7–4.0)
MCH: 30.8 pg (ref 26.0–34.0)
MCHC: 33.4 g/dL (ref 30.0–36.0)
MCV: 92.2 fL (ref 80.0–100.0)
Monocytes Absolute: 0.6 10*3/uL (ref 0.1–1.0)
Monocytes Relative: 11 %
Neutro Abs: 3.5 10*3/uL (ref 1.7–7.7)
Neutrophils Relative %: 69 %
Platelet Count: 384 10*3/uL (ref 150–400)
RBC: 3.86 MIL/uL — ABNORMAL LOW (ref 3.87–5.11)
RDW: 13.3 % (ref 11.5–15.5)
WBC Count: 5.2 10*3/uL (ref 4.0–10.5)
nRBC: 0 % (ref 0.0–0.2)

## 2022-06-29 LAB — RAD ONC ARIA SESSION SUMMARY
Course Elapsed Days: 40
Plan Fractions Treated to Date: 28
Plan Prescribed Dose Per Fraction: 1.8 Gy
Plan Total Fractions Prescribed: 30
Plan Total Prescribed Dose: 54 Gy
Reference Point Dosage Given to Date: 50.4 Gy
Reference Point Session Dosage Given: 1.8 Gy
Session Number: 28
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
## 2022-06-30 ENCOUNTER — Ambulatory Visit
Admission: RE | Admit: 2022-06-30 | Discharge: 2022-06-30 | Disposition: A | Discharge status: Home / Self Care | Payer: Medicaid Other | Source: Ambulatory Visit | Attending: Radiation Oncology | Admitting: Radiation Oncology

## 2022-06-30 ENCOUNTER — Other Ambulatory Visit: Payer: Self-pay

## 2022-06-30 DIAGNOSIS — Z510 Encounter for antineoplastic radiation therapy: Secondary | ICD-10-CM | POA: Diagnosis not present

## 2022-06-30 LAB — RAD ONC ARIA SESSION SUMMARY
Course Elapsed Days: 41
Plan Fractions Treated to Date: 29
Plan Prescribed Dose Per Fraction: 1.8 Gy
Plan Total Fractions Prescribed: 30
Plan Total Prescribed Dose: 54 Gy
Reference Point Dosage Given to Date: 52.2 Gy
Reference Point Session Dosage Given: 1.8 Gy
Session Number: 29

## 2022-07-01 ENCOUNTER — Other Ambulatory Visit: Payer: Self-pay

## 2022-07-01 ENCOUNTER — Ambulatory Visit
Admission: RE | Admit: 2022-07-01 | Discharge: 2022-07-01 | Disposition: A | Discharge status: Home / Self Care | Payer: Medicaid Other | Source: Ambulatory Visit | Attending: Radiation Oncology | Admitting: Radiation Oncology

## 2022-07-01 DIAGNOSIS — Z510 Encounter for antineoplastic radiation therapy: Secondary | ICD-10-CM | POA: Diagnosis not present

## 2022-07-01 LAB — RAD ONC ARIA SESSION SUMMARY
Course Elapsed Days: 42
Plan Fractions Treated to Date: 30
Plan Prescribed Dose Per Fraction: 1.8 Gy
Plan Total Fractions Prescribed: 30
Plan Total Prescribed Dose: 54 Gy
Reference Point Dosage Given to Date: 54 Gy
Reference Point Session Dosage Given: 1.8 Gy
Session Number: 30

## 2022-07-06 NOTE — Radiation Completion Notes (Signed)
Patient Name: Shirley Fisher, Shirley Fisher MRN: [id number] Date of Birth: [birth year] Referring Physician: Elissa Hefty, M.D. Date of Service: 2022-07-06 Radiation Oncologist: Lonie Peak, M.D. [facility]                             RADIATION ONCOLOGY END OF TREATMENT NOTE     Diagnosis: C71.1 Malignant neoplasm of frontal lobe Intent: Curative     ==========DELIVERED PLANS==========  First Treatment Date: 2022-05-20 - Last Treatment Date: 2022-07-01   Plan Name: Brain Site: Frontal Lobe Technique: 3D Mode: Photon Dose Per Fraction: 1.8 Gy Prescribed Dose (Delivered / Prescribed): 54 Gy / 54 Gy Prescribed Fxs (Delivered / Prescribed): 30 / 30     ==========ON TREATMENT VISIT DATES========== 2022-05-24, 2022-05-31, 2022-06-07, 2022-06-14, 2022-06-21, 2022-06-29     ==========UPCOMING VISITS==========       ==========APPENDIX - ON TREATMENT VISIT NOTES==========   See weekly On Treatment Notes in Epic for details.

## 2022-07-09 ENCOUNTER — Telehealth: Payer: Self-pay | Admitting: Internal Medicine

## 2022-07-09 NOTE — Telephone Encounter (Signed)
Called patient regarding June appointments, patient has been called and notified.

## 2022-07-20 NOTE — Progress Notes (Signed)
Shirley Fisher presents today for follow up after completing radiation to her brain on 07-01-22.  Her next MRI is scheduled for 07-22-22.   Recent neurologic symptoms, if any:  Seizures: no Headaches: no Nausea: no Dizziness/ataxia: no Difficulty with hand coordination: no Focal numbness/weakness: no Visual deficits/changes: Blurry vision Confusion/Memory deficits: yes   BP 120/86 (BP Location: Left Arm, Patient Position: Sitting, Cuff Size: Normal)   Pulse 77   Temp (!) 97.3 F (36.3 C)   Resp 18   Ht 5\' 8"  (1.727 m)   Wt 146 lb (66.2 kg)   LMP 01/02/2021   SpO2 100%   BMI 22.20 kg/m

## 2022-07-22 ENCOUNTER — Ambulatory Visit (HOSPITAL_COMMUNITY)
Admission: RE | Admit: 2022-07-22 | Discharge: 2022-07-22 | Disposition: A | Discharge status: Home / Self Care | Payer: Medicaid Other | Source: Ambulatory Visit | Attending: Internal Medicine | Admitting: Internal Medicine

## 2022-07-22 ENCOUNTER — Encounter: Payer: Self-pay | Admitting: Internal Medicine

## 2022-07-22 DIAGNOSIS — C719 Malignant neoplasm of brain, unspecified: Secondary | ICD-10-CM | POA: Diagnosis not present

## 2022-07-22 MED ORDER — GADOBUTROL 1 MMOL/ML IV SOLN
6.0000 mL | Freq: Once | INTRAVENOUS | Status: AC | PRN
  Administered 2022-07-22: 6 mL via INTRAVENOUS

## 2022-07-26 ENCOUNTER — Telehealth: Payer: Self-pay | Admitting: Internal Medicine

## 2022-07-26 NOTE — Telephone Encounter (Signed)
Rescheduled appointments per patient incoming call. Patient is aware of the changes made to her upcoming appointments.

## 2022-07-26 NOTE — Telephone Encounter (Signed)
Called patient twice unable to leave a message, patient was rescheduled

## 2022-07-27 ENCOUNTER — Other Ambulatory Visit: Payer: Self-pay

## 2022-07-27 ENCOUNTER — Inpatient Hospital Stay: Payer: Medicaid Other | Admitting: Internal Medicine

## 2022-07-27 ENCOUNTER — Inpatient Hospital Stay: Payer: Medicaid Other

## 2022-07-30 ENCOUNTER — Ambulatory Visit: Payer: Self-pay | Admitting: Radiation Oncology

## 2022-08-03 ENCOUNTER — Encounter: Payer: Self-pay | Admitting: Radiation Oncology

## 2022-08-03 ENCOUNTER — Ambulatory Visit
Admission: RE | Admit: 2022-08-03 | Discharge: 2022-08-03 | Disposition: A | Discharge status: Home / Self Care | Payer: Medicaid Other | Source: Ambulatory Visit | Attending: Radiation Oncology | Admitting: Radiation Oncology

## 2022-08-03 ENCOUNTER — Other Ambulatory Visit: Payer: Self-pay

## 2022-08-03 VITALS — BP 120/86 | HR 77 | Temp 97.3°F | Resp 18 | Ht 68.0 in | Wt 146.0 lb

## 2022-08-03 DIAGNOSIS — Z79899 Other long term (current) drug therapy: Secondary | ICD-10-CM | POA: Diagnosis not present

## 2022-08-03 DIAGNOSIS — Z923 Personal history of irradiation: Secondary | ICD-10-CM | POA: Diagnosis not present

## 2022-08-03 DIAGNOSIS — Z79630 Long term (current) use of alkylating agent: Secondary | ICD-10-CM | POA: Insufficient documentation

## 2022-08-03 DIAGNOSIS — C711 Malignant neoplasm of frontal lobe: Secondary | ICD-10-CM | POA: Diagnosis present

## 2022-08-03 NOTE — Progress Notes (Signed)
Radiation Oncology         [phone] ________________________________  Name: Shirley Fisher MRN: [id number]  Date: 08/03/2022  DOB: 1971/01/08  Follow-Up Visit Note  Outpatient  CC: Lonie Peak, PA-C  Henreitta Leber, MD  Diagnosis:   Glioma of the frontal lobe [facility]) C71.1 Malignant neoplasm of frontal lobe    ==========DELIVERED PLANS==========  First Treatment Date: 2022-05-20 - Last Treatment Date: 2022-07-01   Plan Name: Brain Site: Frontal Lobe Technique: 3D Mode: Photon Dose Per Fraction: 1.8 Gy Prescribed Dose (Delivered / Prescribed): 54 Gy / 54 Gy Prescribed Fxs (Delivered / Prescribed): 30 / 30     ==========ON TREATMENT VISIT DATES========== 2022-05-24, 2022-05-31, 2022-06-07, 2022-06-14, 2022-06-21, 2022-06-29   CHIEF COMPLAINT: Here for follow-up and surveillance of glioma.   Interval Since Last Radiation:  1 month  Intent: Curative ==========DELIVERED PLANS==========  First Treatment Date: 2022-05-20 - Last Treatment Date: 2022-07-01   Plan Name: Brain Site: Frontal Lobe Technique: 3D Mode: Photon Dose Per Fraction: 1.8 Gy Prescribed Dose (Delivered / Prescribed): 54 Gy / 54 Gy Prescribed Fxs (Delivered / Prescribed): 30 / 30   Narrative:   [name] presents today for follow up after completing radiation to her brain on 07-01-22.   Most recent MRI of the brain on 07/30/22 demonstrates stable postoperative changes without evidence of disease progression.   Symptomatically, denies  seizures, headaches, nausea, or new neurologic deficits.   Patient complains of blurry vision and some memory deficits.  ALLERGIES:  has No Known Allergies.  Meds: Current Outpatient Medications  Medication Sig Dispense Refill   acetaminophen (TYLENOL) 500 MG tablet Take 500-2,000 mg by mouth 2 (two) times daily as needed for moderate pain or headache.     calcium carbonate (TUMS - DOSED IN MG ELEMENTAL CALCIUM) 500 MG chewable tablet Chew 1,000-2,000 mg  by mouth daily as needed for indigestion or heartburn.     clonazePAM (KLONOPIN) 0.5 MG tablet Take 0.5 mg by mouth every 6 (six) hours as needed for anxiety.     FLUoxetine (PROZAC) 40 MG capsule Take 40 mg by mouth daily.     lamoTRIgine (LAMICTAL) 100 MG tablet TAKE 2 TABLETS(200 MG) BY MOUTH IN THE MORNING AND IN THE EVENING 360 tablet 1   losartan (COZAAR) 25 MG tablet Take 25 mg by mouth daily.     ondansetron (ZOFRAN) 8 MG tablet Take 1 tablet (8 mg) by mouth every 8 hours as needed for nausea or vomiting. May take 30 - 60 minutes prior to Temodar administration if nausea/vomiting occurs as needed. (Patient not taking: Reported on 08/03/2022) 30 tablet 1   temozolomide (TEMODAR) 140 MG capsule Take 1 capsule (140 mg total) by mouth daily. May take on an empty stomach to decrease nausea & vomiting. (Patient not taking: Reported on 08/03/2022) 42 capsule 0   zonisamide (ZONEGRAN) 100 MG capsule TAKE 1 CAPSULE(100 MG) BY MOUTH DAILY 60 capsule 2   No current facility-administered medications for this encounter.    Physical Findings: The patient is in no acute distress. Patient is alert and oriented.  height is 5\' 8"  (1.727 m) and weight is 146 lb (66.2 kg). Her temperature is 97.3 F (36.3 C) (abnormal). Her blood pressure is 120/86 and her pulse is 77. Her respiration is 18 and oxygen saturation is 100%. .  No significant changes. CN II-VII grossly intact. Muskuloskeletal and neuroglic exams showed no focal defecits.   Lab Findings: Lab Results  Component Value Date   WBC 5.2 06/29/2022  HGB 11.9 (L) 06/29/2022   HCT 35.6 (L) 06/29/2022   MCV 92.2 06/29/2022   PLT 384 06/29/2022    @LASTCHEMISTRY @  Radiographic Findings: MR BRAIN W WO CONTRAST  Result Date: 07/30/2022 CLINICAL DATA:  Brain/CNS neoplasm, assess treatment response. History of grade 2 glioma. EXAM: MRI HEAD WITHOUT AND WITH CONTRAST TECHNIQUE: Multiplanar, multiecho pulse sequences of the brain and surrounding  structures were obtained without and with intravenous contrast. CONTRAST:  6mL GADAVIST GADOBUTROL 1 MMOL/ML IV SOLN COMPARISON:  MRI brain 04/08/2022 and older. FINDINGS: Brain: Postoperative changes of the left parietal craniotomy with underlying resection cavity along the lateral aspect of the left precentral gyrus. Surrounding area of masslike T2 signal is stable. No new or nodular enhancement. No acute infarct or hemorrhage. No hydrocephalus or midline shift. No extra-axial collection. Vascular: Normal flow voids and vessel enhancement. Skull and upper cervical spine: Prior left parietal craniotomy. Otherwise normal marrow signal and enhancement. Sinuses/Orbits: New moderate mucosal disease in the left sphenoid sinus. Orbits are unremarkable. Other: None. IMPRESSION: 1. Stable postoperative changes without evidence of disease progression. 2. New moderate mucosal disease in the left sphenoid sinus. Electronically Signed   By: Orvan Falconer M.D.   On: 07/30/2022 08:41    Impression/Plan:  Grade II glioma; s/p Temodar and radiation therapy  Patient is doing really well since her radiation therapy. Most recent imaging looks good. She is scheduled to see Dr. Barbaraann Cao on 08/12/22. He will determine when to order the next MRI scan. Radiation oncology follow-up PRN. We appreciate the opportunity to participate in this patient's care. She knows to call back with any questions or concerns.   On date of service, in total, I spent 20 minutes on this encounter. Patient was seen in person. Note signed after encounter date; minutes pertain to date of service, only.  _____________________________________   Joyice Faster, PA-C   Lonie Peak, MD

## 2022-08-04 ENCOUNTER — Encounter: Payer: Self-pay | Admitting: Radiation Oncology

## 2022-08-12 ENCOUNTER — Other Ambulatory Visit: Payer: Self-pay

## 2022-08-12 ENCOUNTER — Inpatient Hospital Stay (HOSPITAL_BASED_OUTPATIENT_CLINIC_OR_DEPARTMENT_OTHER): Payer: Medicaid Other | Admitting: Internal Medicine

## 2022-08-12 ENCOUNTER — Inpatient Hospital Stay: Payer: Medicaid Other | Attending: Radiation Oncology

## 2022-08-12 VITALS — BP 134/87 | HR 77 | Temp 97.9°F | Resp 20 | Wt 145.5 lb

## 2022-08-12 DIAGNOSIS — Z79899 Other long term (current) drug therapy: Secondary | ICD-10-CM | POA: Insufficient documentation

## 2022-08-12 DIAGNOSIS — C711 Malignant neoplasm of frontal lobe: Secondary | ICD-10-CM | POA: Diagnosis present

## 2022-08-12 DIAGNOSIS — Z803 Family history of malignant neoplasm of breast: Secondary | ICD-10-CM | POA: Insufficient documentation

## 2022-08-12 DIAGNOSIS — R569 Unspecified convulsions: Secondary | ICD-10-CM | POA: Diagnosis not present

## 2022-08-12 DIAGNOSIS — C719 Malignant neoplasm of brain, unspecified: Secondary | ICD-10-CM

## 2022-08-12 LAB — CBC WITH DIFFERENTIAL (CANCER CENTER ONLY)
Abs Immature Granulocytes: 0.01 10*3/uL (ref 0.00–0.07)
Basophils Absolute: 0 10*3/uL (ref 0.0–0.1)
Basophils Relative: 1 %
Eosinophils Absolute: 0.2 10*3/uL (ref 0.0–0.5)
Eosinophils Relative: 4 %
HCT: 37.3 % (ref 36.0–46.0)
Hemoglobin: 12.4 g/dL (ref 12.0–15.0)
Immature Granulocytes: 0 %
Lymphocytes Relative: 25 %
Lymphs Abs: 0.9 10*3/uL (ref 0.7–4.0)
MCH: 31.2 pg (ref 26.0–34.0)
MCHC: 33.2 g/dL (ref 30.0–36.0)
MCV: 94 fL (ref 80.0–100.0)
Monocytes Absolute: 0.6 10*3/uL (ref 0.1–1.0)
Monocytes Relative: 15 %
Neutro Abs: 2 10*3/uL (ref 1.7–7.7)
Neutrophils Relative %: 55 %
Platelet Count: 301 10*3/uL (ref 150–400)
RBC: 3.97 MIL/uL (ref 3.87–5.11)
RDW: 12.5 % (ref 11.5–15.5)
WBC Count: 3.7 10*3/uL — ABNORMAL LOW (ref 4.0–10.5)
nRBC: 0 % (ref 0.0–0.2)

## 2022-08-12 LAB — CMP (CANCER CENTER ONLY)
ALT: 10 U/L (ref 0–44)
AST: 16 U/L (ref 15–41)
Albumin: 4.2 g/dL (ref 3.5–5.0)
Alkaline Phosphatase: 104 U/L (ref 38–126)
Anion gap: 7 (ref 5–15)
BUN: 13 mg/dL (ref 6–20)
CO2: 28 mmol/L (ref 22–32)
Calcium: 9.5 mg/dL (ref 8.9–10.3)
Chloride: 107 mmol/L (ref 98–111)
Creatinine: 0.73 mg/dL (ref 0.44–1.00)
GFR, Estimated: >60 mL/min (ref >60)
Glucose, Bld: 67 mg/dL — ABNORMAL LOW (ref 70–99)
Potassium: 3.9 mmol/L (ref 3.5–5.1)
Sodium: 142 mmol/L (ref 135–145)
Total Bilirubin: 0.4 mg/dL (ref 0.3–1.2)
Total Protein: 7 g/dL (ref 6.5–8.1)
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
## 2022-08-14 ENCOUNTER — Other Ambulatory Visit: Payer: Self-pay

## 2022-08-19 ENCOUNTER — Ambulatory Visit: Payer: Medicaid Other | Admitting: Internal Medicine

## 2022-08-19 ENCOUNTER — Other Ambulatory Visit: Payer: Medicaid Other

## 2022-08-26 ENCOUNTER — Other Ambulatory Visit: Payer: Self-pay | Admitting: Internal Medicine

## 2022-08-26 DIAGNOSIS — R569 Unspecified convulsions: Secondary | ICD-10-CM

## 2022-09-20 ENCOUNTER — Other Ambulatory Visit: Payer: Self-pay

## 2022-09-23 ENCOUNTER — Other Ambulatory Visit: Payer: Self-pay

## 2022-09-23 ENCOUNTER — Inpatient Hospital Stay (HOSPITAL_BASED_OUTPATIENT_CLINIC_OR_DEPARTMENT_OTHER): Payer: Medicaid Other | Admitting: Internal Medicine

## 2022-09-23 ENCOUNTER — Inpatient Hospital Stay: Payer: Medicaid Other | Attending: Radiation Oncology

## 2022-09-23 VITALS — BP 135/85 | HR 89 | Temp 98.1°F | Resp 20 | Wt 145.3 lb

## 2022-09-23 DIAGNOSIS — Z9221 Personal history of antineoplastic chemotherapy: Secondary | ICD-10-CM | POA: Diagnosis not present

## 2022-09-23 DIAGNOSIS — Z79899 Other long term (current) drug therapy: Secondary | ICD-10-CM | POA: Insufficient documentation

## 2022-09-23 DIAGNOSIS — C719 Malignant neoplasm of brain, unspecified: Secondary | ICD-10-CM

## 2022-09-23 DIAGNOSIS — Z79630 Long term (current) use of alkylating agent: Secondary | ICD-10-CM | POA: Insufficient documentation

## 2022-09-23 DIAGNOSIS — R569 Unspecified convulsions: Secondary | ICD-10-CM | POA: Diagnosis not present

## 2022-09-23 DIAGNOSIS — C711 Malignant neoplasm of frontal lobe: Secondary | ICD-10-CM | POA: Diagnosis present

## 2022-09-23 LAB — CMP (CANCER CENTER ONLY)
ALT: 16 U/L (ref 0–44)
AST: 21 U/L (ref 15–41)
Albumin: 3.8 g/dL (ref 3.5–5.0)
Alkaline Phosphatase: 114 U/L (ref 38–126)
Anion gap: 9 (ref 5–15)
BUN: 15 mg/dL (ref 6–20)
CO2: 25 mmol/L (ref 22–32)
Calcium: 9.3 mg/dL (ref 8.9–10.3)
Chloride: 107 mmol/L (ref 98–111)
Creatinine: 0.75 mg/dL (ref 0.44–1.00)
GFR, Estimated: >60 mL/min (ref >60)
Glucose, Bld: 72 mg/dL (ref 70–99)
Potassium: 3.9 mmol/L (ref 3.5–5.1)
Sodium: 141 mmol/L (ref 135–145)
Total Bilirubin: 0.3 mg/dL (ref 0.3–1.2)
Total Protein: 7.2 g/dL (ref 6.5–8.1)

## 2022-09-23 LAB — CBC WITH DIFFERENTIAL (CANCER CENTER ONLY)
Abs Immature Granulocytes: 0.01 10*3/uL (ref 0.00–0.07)
Basophils Absolute: 0 10*3/uL (ref 0.0–0.1)
Basophils Relative: 1 %
Eosinophils Absolute: 0.2 10*3/uL (ref 0.0–0.5)
Eosinophils Relative: 5 %
HCT: 35.7 % — ABNORMAL LOW (ref 36.0–46.0)
Hemoglobin: 11.8 g/dL — ABNORMAL LOW (ref 12.0–15.0)
Immature Granulocytes: 0 %
Lymphocytes Relative: 30 %
Lymphs Abs: 1.1 10*3/uL (ref 0.7–4.0)
MCH: 30.9 pg (ref 26.0–34.0)
MCHC: 33.1 g/dL (ref 30.0–36.0)
MCV: 93.5 fL (ref 80.0–100.0)
Monocytes Absolute: 0.5 10*3/uL (ref 0.1–1.0)
Monocytes Relative: 13 %
Neutro Abs: 1.8 10*3/uL (ref 1.7–7.7)
Neutrophils Relative %: 51 %
Platelet Count: 328 10*3/uL (ref 150–400)
RBC: 3.82 MIL/uL — ABNORMAL LOW (ref 3.87–5.11)
RDW: 12.3 % (ref 11.5–15.5)
WBC Count: 3.5 10*3/uL — ABNORMAL LOW (ref 4.0–10.5)
nRBC: 0 % (ref 0.0–0.2)
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
## 2022-09-26 ENCOUNTER — Other Ambulatory Visit: Payer: Self-pay

## 2022-09-28 ENCOUNTER — Other Ambulatory Visit: Payer: Self-pay

## 2022-09-29 ENCOUNTER — Emergency Department (HOSPITAL_COMMUNITY): Payer: Medicaid Other

## 2022-09-29 ENCOUNTER — Other Ambulatory Visit: Payer: Self-pay

## 2022-09-29 ENCOUNTER — Emergency Department (HOSPITAL_COMMUNITY)
Admission: EM | Admit: 2022-09-29 | Discharge: 2022-09-29 | Disposition: A | Discharge status: Home / Self Care | Payer: Medicaid Other | Attending: Emergency Medicine | Admitting: Emergency Medicine

## 2022-09-29 DIAGNOSIS — R5600 Simple febrile convulsions: Secondary | ICD-10-CM | POA: Insufficient documentation

## 2022-09-29 DIAGNOSIS — R569 Unspecified convulsions: Secondary | ICD-10-CM

## 2022-09-29 LAB — CBC WITH DIFFERENTIAL/PLATELET
Abs Immature Granulocytes: 0.02 10*3/uL (ref 0.00–0.07)
Basophils Absolute: 0.1 10*3/uL (ref 0.0–0.1)
Basophils Relative: 1 %
Eosinophils Absolute: 0.1 10*3/uL (ref 0.0–0.5)
Eosinophils Relative: 2 %
HCT: 36.1 % (ref 36.0–46.0)
Hemoglobin: 11.9 g/dL — ABNORMAL LOW (ref 12.0–15.0)
Immature Granulocytes: 0 %
Lymphocytes Relative: 12 %
Lymphs Abs: 0.8 10*3/uL (ref 0.7–4.0)
MCH: 30.5 pg (ref 26.0–34.0)
MCHC: 33 g/dL (ref 30.0–36.0)
MCV: 92.6 fL (ref 80.0–100.0)
Monocytes Absolute: 0.6 10*3/uL (ref 0.1–1.0)
Monocytes Relative: 9 %
Neutro Abs: 5 10*3/uL (ref 1.7–7.7)
Neutrophils Relative %: 76 %
Platelets: 324 10*3/uL (ref 150–400)
RBC: 3.9 MIL/uL (ref 3.87–5.11)
RDW: 12.3 % (ref 11.5–15.5)
WBC: 6.5 10*3/uL (ref 4.0–10.5)
nRBC: 0 % (ref 0.0–0.2)

## 2022-09-29 LAB — BASIC METABOLIC PANEL
Anion gap: 10 (ref 5–15)
BUN: 13 mg/dL (ref 6–20)
CO2: 24 mmol/L (ref 22–32)
Calcium: 9.4 mg/dL (ref 8.9–10.3)
Chloride: 106 mmol/L (ref 98–111)
Creatinine, Ser: 0.71 mg/dL (ref 0.44–1.00)
GFR, Estimated: >60 mL/min (ref >60)
Glucose, Bld: 101 mg/dL — ABNORMAL HIGH (ref 70–99)
Potassium: 3.4 mmol/L — ABNORMAL LOW (ref 3.5–5.1)
Sodium: 140 mmol/L (ref 135–145)

## 2022-09-29 LAB — CBG MONITORING, ED: Glucose-Capillary: 99 mg/dL (ref 70–99)

## 2022-09-29 NOTE — ED Provider Notes (Signed)
[facility] EMERGENCY DEPARTMENT AT [facility] Provider Note   CSN: [id number] Arrival date & time: 09/29/22  1551     History Chief Complaint  Patient presents with   Seizures    HPI Shirley Fisher is a 52 y.o. female presenting for seizure episode.  52 year old female with an extensive history including glioma status postresection currently on chemotherapy.  History of seizures on Lamictal and zonisamide.  Missed doses yesterday. States today she had a seizure episode that lasted approximately 10 minutes. EMS was called and she was administered Versed 2.5 mg IV with resolution. Now she is asymptomatic.  She denies fevers chills nausea vomiting syncope shortness of breath. No prodrome or other symptoms..   Patient's recorded medical, surgical, social, medication list and allergies were reviewed in the Snapshot window as part of the initial history.   Review of Systems   Review of Systems  Constitutional:  Negative for chills and fever.  HENT:  Negative for ear pain and sore throat.   Eyes:  Negative for pain and visual disturbance.  Respiratory:  Negative for cough and shortness of breath.   Cardiovascular:  Negative for chest pain and palpitations.  Gastrointestinal:  Negative for abdominal pain and vomiting.  Genitourinary:  Negative for dysuria and hematuria.  Musculoskeletal:  Negative for arthralgias and back pain.  Skin:  Negative for color change and rash.  Neurological:  Positive for seizures. Negative for syncope.  All other systems reviewed and are negative.   Physical Exam Updated Vital Signs BP 130/87   Pulse 75   Temp 98.5 F (36.9 C) (Oral)   Resp 11   Ht 5\' 8"  (1.727 m)   Wt 65.8 kg   LMP 01/02/2021   SpO2 100%   BMI 22.05 kg/m  Physical Exam Vitals and nursing note reviewed.  Constitutional:      General: She is not in acute distress.    Appearance: She is well-developed.  HENT:     Head: Normocephalic and atraumatic.  Eyes:      Conjunctiva/sclera: Conjunctivae normal.  Cardiovascular:     Rate and Rhythm: Normal rate and regular rhythm.     Heart sounds: No murmur heard. Pulmonary:     Effort: Pulmonary effort is normal. No respiratory distress.     Breath sounds: Normal breath sounds.  Abdominal:     General: There is no distension.     Palpations: Abdomen is soft.     Tenderness: There is no abdominal tenderness. There is no right CVA tenderness or left CVA tenderness.  Musculoskeletal:        General: No swelling or tenderness. Normal range of motion.     Cervical back: Neck supple.  Skin:    General: Skin is warm and dry.  Neurological:     General: No focal deficit present.     Mental Status: She is alert and oriented to person, place, and time. Mental status is at baseline.     Cranial Nerves: No cranial nerve deficit.      ED Course/ Medical Decision Making/ A&P    Procedures Procedures   Medications Ordered in ED Medications - No data to display  Medical Decision Making:    Shirley Fisher is a 52 y.o. female who presented to the ED today with acute on chronic seizure episode detailed above.     Handoff received from EMS.  Patient placed on continuous vitals and telemetry monitoring while in ED which was reviewed periodically.  Complete initial physical exam performed, notably the patient  was hemodynamically stable no acute distress.      Reviewed and confirmed nursing documentation for past medical history, family history, social history.    Initial Assessment:   With the patient's presentation of seizure episode, most likely diagnosis is breakthrough seizure in the setting of medication noncompliance. Other diagnoses were considered including (but not limited to) new intracranial mass, intracranial hemorrhage, metabolic derangement, hypoglycemia. These are considered less likely due to history of present illness and physical exam findings.   This is most consistent with an acute  life/limb threatening illness complicated by underlying chronic conditions.  Initial Plan:  Consultation with patient's primary neuro oncologist Dr. Barbaraann Cao Screening labs including CBC and Metabolic panel to evaluate for infectious or metabolic etiology of disease.  CT head to evaluate for structural intracranial pathology Objective evaluation as below reviewed with plan for close reassessment after extended observation in emergency room  Initial Study Results:   Laboratory  All laboratory results reviewed without evidence of clinically relevant pathology.     Radiology  All images reviewed independently. Agree with radiology report at this time.   CT Head Wo Contrast  Result Date: 09/29/2022 CLINICAL DATA:  Mental status change, unknown cause EXAM: CT HEAD WITHOUT CONTRAST TECHNIQUE: Contiguous axial images were obtained from the base of the skull through the vertex without intravenous contrast. RADIATION DOSE REDUCTION: This exam was performed according to the departmental dose-optimization program which includes automated exposure control, adjustment of the mA and/or kV according to patient size and/or use of iterative reconstruction technique. COMPARISON:  MRI head June 2024. FINDINGS: Brain: Left frontal resection cavity better characterized on prior MRI but similar across modalities. No evidence of acute large vascular territory infarct, acute hemorrhage, midline shift or hydrocephalus. Vascular: No hyperdense vessel. Skull: No acute fracture.  Left-sided craniotomy. Sinuses/Orbits: Clear sinuses.  No acute orbital findings. Other: No mastoid effusions. IMPRESSION: 1. No evidence of acute intracranial abnormality. 2. Left frontal resection cavity better characterized on prior MRI but similar across modalities. Electronically Signed   By: Feliberto Harts M.D.   On: 09/29/2022 16:46     Consults:  Case discussed with Dr. Barbaraann Cao.  Messaged regarding patient's presentation today.  He agreed  with withholding any further medication changes as breakthrough episode is thought to be more consistent with medication noncompliance. He stated he would agree with outpatient follow-up..   Reassessment and Plan:   Watched patient emergency room for 4 hours.  No focal symptoms.  Return to baseline.  Appears to be in remission at this time will recommend close follow-up with Dr. Barbaraann Cao who is in agreement with the plan.  No medication changes today.   Disposition:  I have considered need for hospitalization, however, considering all of the above, I believe this patient is stable for discharge at this time.  Patient/family educated about specific return precautions for given chief complaint and symptoms.  Patient/family educated about follow-up with PCP.     Patient/family expressed understanding of return precautions and need for follow-up. Patient spoken to regarding all imaging and laboratory results and appropriate follow up for these results. All education provided in verbal form with additional information in written form. Time was allowed for answering of patient questions. Patient discharged.    Emergency Department Medication Summary:   Medications - No data to display     Clinical Impression:  1. Seizure [facility])      Discharge   Final Clinical Impression(s) / ED  Diagnoses Final diagnoses:  Seizure [facility])    Rx / DC Orders ED Discharge Orders     None         Glyn Ade, MD 09/29/22 Ernestina [place]

## 2022-09-29 NOTE — ED Triage Notes (Signed)
Pt arrives via [facility] from home for reports of x5 seizures witnessed via family. Hx of brain tumor and recently finished chemo and radiation. 2.5 mg versed IV in route for witnessed seizure x45 seconds.

## 2022-09-30 ENCOUNTER — Encounter: Payer: Self-pay | Admitting: Internal Medicine

## 2022-09-30 MED ORDER — LAMOTRIGINE 100 MG PO TABS: ORAL_TABLET | ORAL | 1 refills | Status: DC

## 2022-10-21 ENCOUNTER — Inpatient Hospital Stay (HOSPITAL_BASED_OUTPATIENT_CLINIC_OR_DEPARTMENT_OTHER): Payer: Medicaid Other | Admitting: Internal Medicine

## 2022-10-21 ENCOUNTER — Other Ambulatory Visit: Payer: Self-pay

## 2022-10-21 ENCOUNTER — Inpatient Hospital Stay: Payer: Medicaid Other | Attending: Radiation Oncology

## 2022-10-21 ENCOUNTER — Other Ambulatory Visit (HOSPITAL_COMMUNITY): Payer: Self-pay

## 2022-10-21 VITALS — BP 128/87 | HR 85 | Temp 98.1°F | Resp 20 | Wt 143.6 lb

## 2022-10-21 DIAGNOSIS — R569 Unspecified convulsions: Secondary | ICD-10-CM | POA: Diagnosis not present

## 2022-10-21 DIAGNOSIS — C711 Malignant neoplasm of frontal lobe: Secondary | ICD-10-CM | POA: Insufficient documentation

## 2022-10-21 DIAGNOSIS — C719 Malignant neoplasm of brain, unspecified: Secondary | ICD-10-CM

## 2022-10-21 DIAGNOSIS — Z9221 Personal history of antineoplastic chemotherapy: Secondary | ICD-10-CM | POA: Insufficient documentation

## 2022-10-21 DIAGNOSIS — Z79899 Other long term (current) drug therapy: Secondary | ICD-10-CM | POA: Insufficient documentation

## 2022-10-21 LAB — CMP (CANCER CENTER ONLY)
ALT: 11 U/L (ref 0–44)
AST: 16 U/L (ref 15–41)
Albumin: 4.6 g/dL (ref 3.5–5.0)
Alkaline Phosphatase: 110 U/L (ref 38–126)
Anion gap: 5 (ref 5–15)
BUN: 12 mg/dL (ref 6–20)
CO2: 31 mmol/L (ref 22–32)
Calcium: 9.7 mg/dL (ref 8.9–10.3)
Chloride: 106 mmol/L (ref 98–111)
Creatinine: 0.74 mg/dL (ref 0.44–1.00)
GFR, Estimated: >60 mL/min (ref >60)
Glucose, Bld: 62 mg/dL — ABNORMAL LOW (ref 70–99)
Potassium: 3.6 mmol/L (ref 3.5–5.1)
Sodium: 142 mmol/L (ref 135–145)
Total Bilirubin: 0.5 mg/dL (ref 0.3–1.2)
Total Protein: 7.2 g/dL (ref 6.5–8.1)

## 2022-10-21 LAB — CBC WITH DIFFERENTIAL (CANCER CENTER ONLY)
Abs Immature Granulocytes: 0.01 10*3/uL (ref 0.00–0.07)
Basophils Absolute: 0 10*3/uL (ref 0.0–0.1)
Basophils Relative: 1 %
Eosinophils Absolute: 0.1 10*3/uL (ref 0.0–0.5)
Eosinophils Relative: 4 %
HCT: 37.5 % (ref 36.0–46.0)
Hemoglobin: 12.2 g/dL (ref 12.0–15.0)
Immature Granulocytes: 0 %
Lymphocytes Relative: 20 %
Lymphs Abs: 0.7 10*3/uL (ref 0.7–4.0)
MCH: 30.4 pg (ref 26.0–34.0)
MCHC: 32.5 g/dL (ref 30.0–36.0)
MCV: 93.5 fL (ref 80.0–100.0)
Monocytes Absolute: 0.4 10*3/uL (ref 0.1–1.0)
Monocytes Relative: 12 %
Neutro Abs: 2.2 10*3/uL (ref 1.7–7.7)
Neutrophils Relative %: 63 %
Platelet Count: 334 10*3/uL (ref 150–400)
RBC: 4.01 MIL/uL (ref 3.87–5.11)
RDW: 12.9 % (ref 11.5–15.5)
WBC Count: 3.5 10*3/uL — ABNORMAL LOW (ref 4.0–10.5)
nRBC: 0 % (ref 0.0–0.2)

## 2022-10-21 MED ORDER — NAYZILAM 5 MG/0.1ML NA SOLN: 5.0000 mg | Freq: Four times a day (QID) | NASAL | 0 refills | Status: DC | PRN

## 2022-10-21 MED ORDER — TEMOZOLOMIDE 250 MG PO CAPS
250.0000 mg | ORAL_CAPSULE | Freq: Every day | ORAL | 0 refills | Status: DC
Start: 2022-10-21 — End: 2022-11-16
  Filled 2022-10-21: qty 5, 28d supply, fill #0
  Filled 2022-10-21: qty 5, 5d supply, fill #0

## 2022-10-21 MED ORDER — TEMOZOLOMIDE 100 MG PO CAPS
100.0000 mg | ORAL_CAPSULE | Freq: Every day | ORAL | 0 refills | Status: DC
Start: 2022-10-21 — End: 2022-11-16
  Filled 2022-10-21: qty 5, 28d supply, fill #0
  Filled 2022-10-21: qty 5, 5d supply, fill #0
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
## 2022-10-22 ENCOUNTER — Other Ambulatory Visit (HOSPITAL_COMMUNITY): Payer: Self-pay

## 2022-10-22 ENCOUNTER — Other Ambulatory Visit: Payer: Self-pay

## 2022-10-23 ENCOUNTER — Other Ambulatory Visit: Payer: Self-pay

## 2022-10-26 ENCOUNTER — Other Ambulatory Visit: Payer: Self-pay

## 2022-11-11 ENCOUNTER — Other Ambulatory Visit: Payer: Self-pay

## 2022-11-11 ENCOUNTER — Ambulatory Visit (HOSPITAL_COMMUNITY)
Admission: RE | Admit: 2022-11-11 | Discharge: 2022-11-11 | Disposition: A | Discharge status: Home / Self Care | Payer: Medicaid Other | Source: Ambulatory Visit | Attending: Internal Medicine | Admitting: Internal Medicine

## 2022-11-11 DIAGNOSIS — C719 Malignant neoplasm of brain, unspecified: Secondary | ICD-10-CM | POA: Diagnosis present

## 2022-11-11 MED ORDER — GADOBUTROL 1 MMOL/ML IV SOLN
6.5000 mL | Freq: Once | INTRAVENOUS | Status: AC | PRN
  Administered 2022-11-11: 6.5 mL via INTRAVENOUS

## 2022-11-16 ENCOUNTER — Other Ambulatory Visit: Payer: Self-pay

## 2022-11-16 ENCOUNTER — Other Ambulatory Visit (HOSPITAL_COMMUNITY): Payer: Self-pay

## 2022-11-16 ENCOUNTER — Inpatient Hospital Stay: Payer: Medicaid Other | Attending: Radiation Oncology

## 2022-11-16 ENCOUNTER — Inpatient Hospital Stay (HOSPITAL_BASED_OUTPATIENT_CLINIC_OR_DEPARTMENT_OTHER): Payer: Medicaid Other | Admitting: Internal Medicine

## 2022-11-16 VITALS — BP 126/75 | HR 83 | Temp 97.9°F | Resp 20 | Wt 144.9 lb

## 2022-11-16 DIAGNOSIS — R569 Unspecified convulsions: Secondary | ICD-10-CM | POA: Diagnosis not present

## 2022-11-16 DIAGNOSIS — Z923 Personal history of irradiation: Secondary | ICD-10-CM | POA: Insufficient documentation

## 2022-11-16 DIAGNOSIS — Z9221 Personal history of antineoplastic chemotherapy: Secondary | ICD-10-CM | POA: Diagnosis not present

## 2022-11-16 DIAGNOSIS — C711 Malignant neoplasm of frontal lobe: Secondary | ICD-10-CM | POA: Insufficient documentation

## 2022-11-16 DIAGNOSIS — Z79899 Other long term (current) drug therapy: Secondary | ICD-10-CM | POA: Insufficient documentation

## 2022-11-16 DIAGNOSIS — C719 Malignant neoplasm of brain, unspecified: Secondary | ICD-10-CM

## 2022-11-16 LAB — CMP (CANCER CENTER ONLY)
ALT: 13 U/L (ref 0–44)
AST: 15 U/L (ref 15–41)
Albumin: 4.4 g/dL (ref 3.5–5.0)
Alkaline Phosphatase: 98 U/L (ref 38–126)
Anion gap: 5 (ref 5–15)
BUN: 16 mg/dL (ref 6–20)
CO2: 27 mmol/L (ref 22–32)
Calcium: 9.3 mg/dL (ref 8.9–10.3)
Chloride: 110 mmol/L (ref 98–111)
Creatinine: 0.69 mg/dL (ref 0.44–1.00)
GFR, Estimated: >60 mL/min (ref >60)
Glucose, Bld: 81 mg/dL (ref 70–99)
Potassium: 3.9 mmol/L (ref 3.5–5.1)
Sodium: 142 mmol/L (ref 135–145)
Total Bilirubin: 0.3 mg/dL (ref 0.3–1.2)
Total Protein: 6.8 g/dL (ref 6.5–8.1)

## 2022-11-16 LAB — CBC WITH DIFFERENTIAL (CANCER CENTER ONLY)
Abs Immature Granulocytes: 0.01 10*3/uL (ref 0.00–0.07)
Basophils Absolute: 0 10*3/uL (ref 0.0–0.1)
Basophils Relative: 1 %
Eosinophils Absolute: 0.1 10*3/uL (ref 0.0–0.5)
Eosinophils Relative: 4 %
HCT: 33.3 % — ABNORMAL LOW (ref 36.0–46.0)
Hemoglobin: 11.2 g/dL — ABNORMAL LOW (ref 12.0–15.0)
Immature Granulocytes: 0 %
Lymphocytes Relative: 20 %
Lymphs Abs: 0.8 10*3/uL (ref 0.7–4.0)
MCH: 31.7 pg (ref 26.0–34.0)
MCHC: 33.6 g/dL (ref 30.0–36.0)
MCV: 94.3 fL (ref 80.0–100.0)
Monocytes Absolute: 0.6 10*3/uL (ref 0.1–1.0)
Monocytes Relative: 14 %
Neutro Abs: 2.5 10*3/uL (ref 1.7–7.7)
Neutrophils Relative %: 61 %
Platelet Count: 312 10*3/uL (ref 150–400)
RBC: 3.53 MIL/uL — ABNORMAL LOW (ref 3.87–5.11)
RDW: 13.3 % (ref 11.5–15.5)
WBC Count: 4 10*3/uL (ref 4.0–10.5)
nRBC: 0 % (ref 0.0–0.2)

## 2022-11-16 MED ORDER — TEMOZOLOMIDE 250 MG PO CAPS
250.0000 mg | ORAL_CAPSULE | Freq: Every day | ORAL | 0 refills | Status: DC
Start: 2022-11-16 — End: 2022-12-21
  Filled 2022-11-16 (×2): qty 5, 5d supply, fill #0

## 2022-11-16 MED ORDER — TEMOZOLOMIDE 100 MG PO CAPS
100.0000 mg | ORAL_CAPSULE | Freq: Every day | ORAL | 0 refills | Status: DC
Start: 2022-11-16 — End: 2022-12-21
  Filled 2022-11-16 (×2): qty 5, 5d supply, fill #0

## 2022-11-16 NOTE — Progress Notes (Signed)
Specialty Pharmacy Refill Coordination Note  Shirley Fisher is a 52 y.o. female contacted today regarding refills of specialty medication(s) Temozolomide   Patient requested Delivery   Delivery date: 11/19/22   Verified address: [address] [place] [address]   Medication will be filled on 11/18/22. Patient to start next cycle on 11/24/22.
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
## 2022-11-16 NOTE — Progress Notes (Signed)
Specialty Pharmacy Ongoing Clinical Assessment Note  Shirley Fisher is a 52 y.o. female who is being followed by the specialty pharmacy service for RxSp Oncology   Patient's specialty medication(s) reviewed today: Temozolomide   Missed doses in the last 4 weeks: 0   Patient/Caregiver did not have any additional questions or concerns.   Therapeutic benefit summary: Patient is achieving benefit   Adverse events/side effects summary: No adverse events/side effects   Patient's therapy is appropriate to: Continue    Goals Addressed             This Visit's Progress    Slow Disease Progression       Patient is on track. Patient will maintain adherence.  Dr. Barbaraann Cao reported patient remains clinically stable at today's visit.          Follow up:  3 months  Servando Snare Specialty Pharmacist

## 2022-11-19 ENCOUNTER — Other Ambulatory Visit: Payer: Self-pay

## 2022-12-02 ENCOUNTER — Other Ambulatory Visit (HOSPITAL_COMMUNITY): Payer: Self-pay

## 2022-12-02 ENCOUNTER — Other Ambulatory Visit: Payer: Self-pay

## 2022-12-13 ENCOUNTER — Other Ambulatory Visit: Payer: Self-pay

## 2022-12-21 ENCOUNTER — Other Ambulatory Visit: Payer: Medicaid Other

## 2022-12-21 ENCOUNTER — Other Ambulatory Visit (HOSPITAL_COMMUNITY): Payer: Self-pay

## 2022-12-21 ENCOUNTER — Ambulatory Visit: Payer: Medicaid Other | Admitting: Internal Medicine

## 2022-12-21 ENCOUNTER — Other Ambulatory Visit: Payer: Self-pay

## 2022-12-21 ENCOUNTER — Inpatient Hospital Stay: Payer: Medicaid Other | Attending: Radiation Oncology

## 2022-12-21 ENCOUNTER — Inpatient Hospital Stay: Payer: Medicaid Other | Attending: Radiation Oncology | Admitting: Internal Medicine

## 2022-12-21 VITALS — BP 125/84 | HR 78 | Temp 98.2°F | Resp 14 | Ht 68.0 in | Wt 147.7 lb

## 2022-12-21 DIAGNOSIS — C719 Malignant neoplasm of brain, unspecified: Secondary | ICD-10-CM | POA: Diagnosis not present

## 2022-12-21 DIAGNOSIS — Z9221 Personal history of antineoplastic chemotherapy: Secondary | ICD-10-CM | POA: Diagnosis not present

## 2022-12-21 DIAGNOSIS — Z79899 Other long term (current) drug therapy: Secondary | ICD-10-CM | POA: Diagnosis not present

## 2022-12-21 DIAGNOSIS — R569 Unspecified convulsions: Secondary | ICD-10-CM

## 2022-12-21 DIAGNOSIS — C711 Malignant neoplasm of frontal lobe: Secondary | ICD-10-CM | POA: Diagnosis present

## 2022-12-21 DIAGNOSIS — Z923 Personal history of irradiation: Secondary | ICD-10-CM | POA: Diagnosis not present

## 2022-12-21 LAB — CMP (CANCER CENTER ONLY)
ALT: 14 U/L (ref 0–44)
AST: 18 U/L (ref 15–41)
Albumin: 4.4 g/dL (ref 3.5–5.0)
Alkaline Phosphatase: 107 U/L (ref 38–126)
Anion gap: 6 (ref 5–15)
BUN: 12 mg/dL (ref 6–20)
CO2: 29 mmol/L (ref 22–32)
Calcium: 9.5 mg/dL (ref 8.9–10.3)
Chloride: 106 mmol/L (ref 98–111)
Creatinine: 0.75 mg/dL (ref 0.44–1.00)
GFR, Estimated: >60 mL/min (ref >60)
Glucose, Bld: 82 mg/dL (ref 70–99)
Potassium: 3.6 mmol/L (ref 3.5–5.1)
Sodium: 141 mmol/L (ref 135–145)
Total Bilirubin: 0.4 mg/dL (ref <1.2)
Total Protein: 6.9 g/dL (ref 6.5–8.1)

## 2022-12-21 LAB — CBC WITH DIFFERENTIAL (CANCER CENTER ONLY)
Abs Immature Granulocytes: 0.02 10*3/uL (ref 0.00–0.07)
Basophils Absolute: 0 10*3/uL (ref 0.0–0.1)
Basophils Relative: 1 %
Eosinophils Absolute: 0.2 10*3/uL (ref 0.0–0.5)
Eosinophils Relative: 4 %
HCT: 36.9 % (ref 36.0–46.0)
Hemoglobin: 12.1 g/dL (ref 12.0–15.0)
Immature Granulocytes: 1 %
Lymphocytes Relative: 25 %
Lymphs Abs: 0.9 10*3/uL (ref 0.7–4.0)
MCH: 31.7 pg (ref 26.0–34.0)
MCHC: 32.8 g/dL (ref 30.0–36.0)
MCV: 96.6 fL (ref 80.0–100.0)
Monocytes Absolute: 0.5 10*3/uL (ref 0.1–1.0)
Monocytes Relative: 13 %
Neutro Abs: 2.1 10*3/uL (ref 1.7–7.7)
Neutrophils Relative %: 56 %
Platelet Count: 275 10*3/uL (ref 150–400)
RBC: 3.82 MIL/uL — ABNORMAL LOW (ref 3.87–5.11)
RDW: 13 % (ref 11.5–15.5)
WBC Count: 3.7 10*3/uL — ABNORMAL LOW (ref 4.0–10.5)
nRBC: 0 % (ref 0.0–0.2)

## 2022-12-21 MED ORDER — TEMOZOLOMIDE 250 MG PO CAPS
250.0000 mg | ORAL_CAPSULE | Freq: Every day | ORAL | 0 refills | Status: DC
Start: 2022-12-21 — End: 2023-06-21
  Filled 2022-12-21: qty 5, 28d supply, fill #0
  Filled 2022-12-21: qty 5, 5d supply, fill #0

## 2022-12-21 MED ORDER — TEMOZOLOMIDE 100 MG PO CAPS
100.0000 mg | ORAL_CAPSULE | Freq: Every day | ORAL | 0 refills | Status: DC
Start: 2022-12-21 — End: 2023-06-21
  Filled 2022-12-21: qty 5, 5d supply, fill #0
  Filled 2022-12-21: qty 5, 28d supply, fill #0
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
## 2022-12-21 NOTE — Progress Notes (Signed)
Specialty Pharmacy Refill Coordination Note  Shirley Fisher is a 52 y.o. female contacted today regarding refills of specialty medication(s) Temozolomide   Patient requested Delivery   Delivery date: 12/23/22   Verified address: [address] [place] [address]   Medication will be filled on 12/22/22.   Next cycle 12/25/22.

## 2022-12-22 ENCOUNTER — Telehealth: Payer: Self-pay | Admitting: Internal Medicine

## 2022-12-23 ENCOUNTER — Other Ambulatory Visit: Payer: Self-pay

## 2023-01-10 ENCOUNTER — Other Ambulatory Visit: Payer: Self-pay

## 2023-01-13 ENCOUNTER — Other Ambulatory Visit: Payer: Self-pay

## 2023-01-17 ENCOUNTER — Other Ambulatory Visit: Payer: Self-pay

## 2023-01-18 ENCOUNTER — Other Ambulatory Visit: Payer: Self-pay

## 2023-01-18 ENCOUNTER — Other Ambulatory Visit (HOSPITAL_COMMUNITY): Payer: Self-pay

## 2023-01-18 ENCOUNTER — Inpatient Hospital Stay (HOSPITAL_BASED_OUTPATIENT_CLINIC_OR_DEPARTMENT_OTHER): Payer: Medicaid Other | Admitting: Internal Medicine

## 2023-01-18 ENCOUNTER — Inpatient Hospital Stay: Payer: Medicaid Other | Attending: Radiation Oncology

## 2023-01-18 VITALS — BP 127/88 | HR 78 | Temp 97.7°F | Resp 16 | Wt 147.9 lb

## 2023-01-18 DIAGNOSIS — Z79899 Other long term (current) drug therapy: Secondary | ICD-10-CM | POA: Diagnosis not present

## 2023-01-18 DIAGNOSIS — Z923 Personal history of irradiation: Secondary | ICD-10-CM | POA: Insufficient documentation

## 2023-01-18 DIAGNOSIS — Z9221 Personal history of antineoplastic chemotherapy: Secondary | ICD-10-CM | POA: Diagnosis not present

## 2023-01-18 DIAGNOSIS — R569 Unspecified convulsions: Secondary | ICD-10-CM

## 2023-01-18 DIAGNOSIS — C719 Malignant neoplasm of brain, unspecified: Secondary | ICD-10-CM

## 2023-01-18 DIAGNOSIS — C711 Malignant neoplasm of frontal lobe: Secondary | ICD-10-CM | POA: Diagnosis present

## 2023-01-18 LAB — CMP (CANCER CENTER ONLY)
ALT: 13 U/L (ref 0–44)
AST: 15 U/L (ref 15–41)
Albumin: 4.6 g/dL (ref 3.5–5.0)
Alkaline Phosphatase: 123 U/L (ref 38–126)
Anion gap: 6 (ref 5–15)
BUN: 13 mg/dL (ref 6–20)
CO2: 28 mmol/L (ref 22–32)
Calcium: 10 mg/dL (ref 8.9–10.3)
Chloride: 106 mmol/L (ref 98–111)
Creatinine: 0.69 mg/dL (ref 0.44–1.00)
GFR, Estimated: >60 mL/min (ref >60)
Glucose, Bld: 88 mg/dL (ref 70–99)
Potassium: 4.6 mmol/L (ref 3.5–5.1)
Sodium: 140 mmol/L (ref 135–145)
Total Bilirubin: 0.3 mg/dL (ref <1.2)
Total Protein: 7.3 g/dL (ref 6.5–8.1)

## 2023-01-18 LAB — CBC WITH DIFFERENTIAL (CANCER CENTER ONLY)
Abs Immature Granulocytes: 0.02 10*3/uL (ref 0.00–0.07)
Basophils Absolute: 0 10*3/uL (ref 0.0–0.1)
Basophils Relative: 1 %
Eosinophils Absolute: 0.1 10*3/uL (ref 0.0–0.5)
Eosinophils Relative: 3 %
HCT: 37.5 % (ref 36.0–46.0)
Hemoglobin: 12.9 g/dL (ref 12.0–15.0)
Immature Granulocytes: 0 %
Lymphocytes Relative: 15 %
Lymphs Abs: 0.8 10*3/uL (ref 0.7–4.0)
MCH: 32.5 pg (ref 26.0–34.0)
MCHC: 34.4 g/dL (ref 30.0–36.0)
MCV: 94.5 fL (ref 80.0–100.0)
Monocytes Absolute: 0.5 10*3/uL (ref 0.1–1.0)
Monocytes Relative: 10 %
Neutro Abs: 3.5 10*3/uL (ref 1.7–7.7)
Neutrophils Relative %: 71 %
Platelet Count: 400 10*3/uL (ref 150–400)
RBC: 3.97 MIL/uL (ref 3.87–5.11)
RDW: 12.6 % (ref 11.5–15.5)
WBC Count: 5 10*3/uL (ref 4.0–10.5)
nRBC: 0 % (ref 0.0–0.2)

## 2023-01-18 MED ORDER — TEMOZOLOMIDE 100 MG PO CAPS
100.0000 mg | ORAL_CAPSULE | Freq: Every day | ORAL | 0 refills | Status: DC
Start: 2023-01-18 — End: 2023-06-21
  Filled 2023-01-18: qty 5, 28d supply, fill #0
  Filled 2023-01-18: qty 5, 5d supply, fill #0

## 2023-01-18 MED ORDER — TEMOZOLOMIDE 250 MG PO CAPS
250.0000 mg | ORAL_CAPSULE | Freq: Every day | ORAL | 0 refills | Status: DC
Start: 2023-01-18 — End: 2023-06-21
  Filled 2023-01-18: qty 5, 28d supply, fill #0
  Filled 2023-01-18: qty 5, 5d supply, fill #0
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
## 2023-01-18 NOTE — Progress Notes (Signed)
Specialty Pharmacy Refill Coordination Note  Shirley Fisher is a 52 y.o. female contacted today regarding refills of specialty medication(s) Temozolomide [facility])   Patient requested Delivery   Delivery date: 01/20/23   Verified address: [address] [place] [address]   Medication will be filled on 01/19/23.

## 2023-01-19 ENCOUNTER — Encounter: Payer: Self-pay | Admitting: Internal Medicine

## 2023-01-19 ENCOUNTER — Telehealth: Payer: Self-pay | Admitting: Pharmacy Technician

## 2023-01-19 ENCOUNTER — Other Ambulatory Visit (HOSPITAL_COMMUNITY): Payer: Self-pay

## 2023-01-19 NOTE — Telephone Encounter (Signed)
Oral Oncology Patient Advocate Encounter  Received message from [facility] team stating patient's insurance with [employer]  Medicaid had been terminated.  I spoke with the patient and advised them of the rejection. Patient was unaware her medicaid was no longer active and is going to reach out to the program to se if it can be corrected.  If corrected medication will continue to be processed at [facility]. If there is going to be a large delay (~1 week or more) then patient is willing to consider filling her medication with [facility] until medicaid can be reinstated.  The estimated cost at [facility] is $39.35  plus shipping and handling. Patient has been advised of this estimate.  Jinger Neighbors, CPhT-Adv Oncology Pharmacy Patient Advocate [facility] Direct Number: [phone]  Fax: [phone]

## 2023-01-20 ENCOUNTER — Other Ambulatory Visit (HOSPITAL_COMMUNITY): Payer: Self-pay

## 2023-01-20 ENCOUNTER — Other Ambulatory Visit: Payer: Self-pay | Admitting: Medical Genetics

## 2023-01-20 ENCOUNTER — Other Ambulatory Visit: Payer: Self-pay

## 2023-01-21 ENCOUNTER — Other Ambulatory Visit (HOSPITAL_COMMUNITY): Payer: Self-pay

## 2023-01-21 ENCOUNTER — Other Ambulatory Visit: Payer: Self-pay

## 2023-01-23 ENCOUNTER — Other Ambulatory Visit: Payer: Self-pay

## 2023-01-24 ENCOUNTER — Other Ambulatory Visit (HOSPITAL_COMMUNITY): Payer: Self-pay

## 2023-01-27 ENCOUNTER — Other Ambulatory Visit: Payer: Self-pay | Admitting: Internal Medicine

## 2023-01-27 ENCOUNTER — Other Ambulatory Visit: Payer: Self-pay

## 2023-01-27 ENCOUNTER — Encounter: Payer: Self-pay | Admitting: Internal Medicine

## 2023-01-27 ENCOUNTER — Other Ambulatory Visit (HOSPITAL_COMMUNITY): Payer: Self-pay

## 2023-01-27 DIAGNOSIS — C719 Malignant neoplasm of brain, unspecified: Secondary | ICD-10-CM

## 2023-01-27 MED ORDER — TEMOZOLOMIDE 100 MG PO CAPS
100.0000 mg | ORAL_CAPSULE | Freq: Every day | ORAL | 0 refills | Status: DC
Start: 2023-01-27 — End: 2023-06-21

## 2023-01-27 MED ORDER — TEMOZOLOMIDE 250 MG PO CAPS
250.0000 mg | ORAL_CAPSULE | Freq: Every day | ORAL | 0 refills | Status: DC
Start: 2023-01-27 — End: 2023-06-21

## 2023-01-27 MED ORDER — ONDANSETRON HCL 8 MG PO TABS: 8.0000 mg | ORAL_TABLET | Freq: Three times a day (TID) | ORAL | 1 refills | Status: DC | PRN

## 2023-01-27 NOTE — Telephone Encounter (Signed)
Oral Oncology Patient Advocate Encounter  Patient is awaiting a determination from medicaid for re-enrollment. In the interim she has requested to fill her temozolomide at [facility] online pharmacy at a discounted price.   I have sent an email to the patient with instructions on how to set up an account so that she can order medication with this pharmacy.  Patient confirmed email: [email or web address]   This is also the email patient will be using to create her account.  Patient is aware she can choose expedited shipping at checkout so that the medication can be prioritized to ship out sooner.  Jinger Neighbors, CPhT-Adv Oncology Pharmacy Patient Advocate [facility] Direct Number: [phone]  Fax: [phone]

## 2023-02-01 NOTE — Telephone Encounter (Signed)
 Oral Oncology Patient Advocate Encounter  Confirmed patient was able to successfully order temozolomide  from [facility]. Patient stated the estimated delivery was by the end of the week, ~02/04/23.  Estefana Moellers, CPhT-Adv Oncology Pharmacy Patient Advocate [facility] Direct Number: [phone]  Fax: [phone]

## 2023-02-08 ENCOUNTER — Encounter: Payer: Self-pay | Admitting: Internal Medicine

## 2023-02-09 ENCOUNTER — Encounter: Payer: Self-pay | Admitting: Internal Medicine

## 2023-02-12 ENCOUNTER — Other Ambulatory Visit: Payer: Self-pay

## 2023-02-15 ENCOUNTER — Other Ambulatory Visit: Payer: Medicaid Other

## 2023-02-15 ENCOUNTER — Ambulatory Visit: Payer: Medicaid Other | Admitting: Internal Medicine

## 2023-02-15 ENCOUNTER — Other Ambulatory Visit (HOSPITAL_COMMUNITY): Payer: Medicaid Other

## 2023-02-17 ENCOUNTER — Other Ambulatory Visit: Payer: Self-pay

## 2023-02-22 ENCOUNTER — Other Ambulatory Visit: Payer: Self-pay | Admitting: Internal Medicine

## 2023-02-22 DIAGNOSIS — R569 Unspecified convulsions: Secondary | ICD-10-CM

## 2023-03-01 ENCOUNTER — Encounter: Payer: Self-pay | Admitting: Internal Medicine

## 2023-03-04 ENCOUNTER — Other Ambulatory Visit: Payer: Self-pay | Admitting: Internal Medicine

## 2023-03-04 ENCOUNTER — Encounter: Payer: Self-pay | Admitting: Internal Medicine

## 2023-03-04 MED ORDER — NAYZILAM 5 MG/0.1ML NA SOLN: 5.0000 mg | Freq: Four times a day (QID) | NASAL | 0 refills | Status: DC | PRN

## 2023-03-07 ENCOUNTER — Encounter: Payer: Self-pay | Admitting: Internal Medicine

## 2023-03-10 ENCOUNTER — Ambulatory Visit (HOSPITAL_COMMUNITY)
Admission: RE | Admit: 2023-03-10 | Discharge: 2023-03-10 | Disposition: A | Discharge status: Home / Self Care | Payer: No Typology Code available for payment source | Source: Ambulatory Visit | Attending: Internal Medicine | Admitting: Internal Medicine

## 2023-03-10 DIAGNOSIS — C719 Malignant neoplasm of brain, unspecified: Secondary | ICD-10-CM | POA: Diagnosis present

## 2023-03-10 MED ORDER — GADOBUTROL 1 MMOL/ML IV SOLN
7.0000 mL | Freq: Once | INTRAVENOUS | Status: AC | PRN
  Administered 2023-03-10: 7 mL via INTRAVENOUS

## 2023-03-15 ENCOUNTER — Other Ambulatory Visit (HOSPITAL_COMMUNITY): Payer: Self-pay

## 2023-03-15 ENCOUNTER — Inpatient Hospital Stay: Payer: No Typology Code available for payment source | Attending: Radiation Oncology

## 2023-03-15 ENCOUNTER — Telehealth: Payer: Self-pay | Admitting: Pharmacy Technician

## 2023-03-15 ENCOUNTER — Inpatient Hospital Stay (HOSPITAL_BASED_OUTPATIENT_CLINIC_OR_DEPARTMENT_OTHER): Payer: No Typology Code available for payment source | Admitting: Internal Medicine

## 2023-03-15 ENCOUNTER — Other Ambulatory Visit (HOSPITAL_COMMUNITY)
Admission: RE | Admit: 2023-03-15 | Discharge: 2023-03-15 | Disposition: A | Discharge status: Home / Self Care | Payer: Self-pay | Source: Ambulatory Visit | Attending: Medical Genetics | Admitting: Medical Genetics

## 2023-03-15 ENCOUNTER — Encounter: Payer: Self-pay | Admitting: Internal Medicine

## 2023-03-15 VITALS — BP 142/86 | HR 82 | Temp 98.5°F | Resp 16 | Wt 148.3 lb

## 2023-03-15 DIAGNOSIS — Z79899 Other long term (current) drug therapy: Secondary | ICD-10-CM | POA: Insufficient documentation

## 2023-03-15 DIAGNOSIS — Z90710 Acquired absence of both cervix and uterus: Secondary | ICD-10-CM | POA: Insufficient documentation

## 2023-03-15 DIAGNOSIS — Z803 Family history of malignant neoplasm of breast: Secondary | ICD-10-CM | POA: Diagnosis not present

## 2023-03-15 DIAGNOSIS — C719 Malignant neoplasm of brain, unspecified: Secondary | ICD-10-CM | POA: Diagnosis not present

## 2023-03-15 DIAGNOSIS — Z79630 Long term (current) use of alkylating agent: Secondary | ICD-10-CM | POA: Diagnosis not present

## 2023-03-15 DIAGNOSIS — R569 Unspecified convulsions: Secondary | ICD-10-CM

## 2023-03-15 DIAGNOSIS — R253 Fasciculation: Secondary | ICD-10-CM | POA: Diagnosis not present

## 2023-03-15 DIAGNOSIS — C711 Malignant neoplasm of frontal lobe: Secondary | ICD-10-CM | POA: Insufficient documentation

## 2023-03-15 LAB — CMP (CANCER CENTER ONLY)
ALT: 14 U/L (ref 0–44)
AST: 17 U/L (ref 15–41)
Albumin: 4.5 g/dL (ref 3.5–5.0)
Alkaline Phosphatase: 130 U/L — ABNORMAL HIGH (ref 38–126)
Anion gap: 7 (ref 5–15)
BUN: 14 mg/dL (ref 6–20)
CO2: 29 mmol/L (ref 22–32)
Calcium: 9.6 mg/dL (ref 8.9–10.3)
Chloride: 106 mmol/L (ref 98–111)
Creatinine: 0.71 mg/dL (ref 0.44–1.00)
GFR, Estimated: >60 mL/min (ref >60)
Glucose, Bld: 74 mg/dL (ref 70–99)
Potassium: 4.1 mmol/L (ref 3.5–5.1)
Sodium: 142 mmol/L (ref 135–145)
Total Bilirubin: 0.3 mg/dL (ref 0.0–1.2)
Total Protein: 7.1 g/dL (ref 6.5–8.1)

## 2023-03-15 LAB — CBC WITH DIFFERENTIAL (CANCER CENTER ONLY)
Abs Immature Granulocytes: 0.02 10*3/uL (ref 0.00–0.07)
Basophils Absolute: 0 10*3/uL (ref 0.0–0.1)
Basophils Relative: 1 %
Eosinophils Absolute: 0.1 10*3/uL (ref 0.0–0.5)
Eosinophils Relative: 3 %
HCT: 37.6 % (ref 36.0–46.0)
Hemoglobin: 12.3 g/dL (ref 12.0–15.0)
Immature Granulocytes: 0 %
Lymphocytes Relative: 14 %
Lymphs Abs: 0.7 10*3/uL (ref 0.7–4.0)
MCH: 31.5 pg (ref 26.0–34.0)
MCHC: 32.7 g/dL (ref 30.0–36.0)
MCV: 96.2 fL (ref 80.0–100.0)
Monocytes Absolute: 0.5 10*3/uL (ref 0.1–1.0)
Monocytes Relative: 11 %
Neutro Abs: 3.5 10*3/uL (ref 1.7–7.7)
Neutrophils Relative %: 71 %
Platelet Count: 309 10*3/uL (ref 150–400)
RBC: 3.91 MIL/uL (ref 3.87–5.11)
RDW: 12.6 % (ref 11.5–15.5)
WBC Count: 4.8 10*3/uL (ref 4.0–10.5)
nRBC: 0 % (ref 0.0–0.2)

## 2023-03-15 MED ORDER — TEMOZOLOMIDE 100 MG PO CAPS
100.0000 mg | ORAL_CAPSULE | Freq: Every day | ORAL | 0 refills | Status: DC
Start: 2023-03-15 — End: 2023-06-21
  Filled 2023-03-16: qty 5, 28d supply, fill #0

## 2023-03-15 MED ORDER — TEMOZOLOMIDE 250 MG PO CAPS
250.0000 mg | ORAL_CAPSULE | Freq: Every day | ORAL | 0 refills | Status: DC
Start: 2023-03-15 — End: 2023-06-21
  Filled 2023-03-16: qty 5, 28d supply, fill #0
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
## 2023-03-15 NOTE — Telephone Encounter (Signed)
Oral Oncology Patient Advocate Encounter   Received notification that prior authorization for Temodar is required.   PA submitted on 03/15/2023 Key [id number] Status is pending     Patty Almedia Balls, CPhT Oncology Pharmacy Patient Advocate [facility] Direct Number: [phone] Fax: [phone]

## 2023-03-16 ENCOUNTER — Other Ambulatory Visit: Payer: Self-pay

## 2023-03-16 ENCOUNTER — Other Ambulatory Visit (HOSPITAL_COMMUNITY): Payer: Self-pay

## 2023-03-16 ENCOUNTER — Other Ambulatory Visit: Payer: Self-pay | Admitting: Pharmacy Technician

## 2023-03-16 NOTE — Progress Notes (Signed)
Specialty Pharmacy Initial Fill Coordination Note  Shirley Fisher is a 53 y.o. female contacted today regarding refills of specialty medication(s) Temozolomide (TEMODAR) .  Patient requested Delivery  on 03/18/23  to verified address [address]   [place] [address]   Medication will be filled on 03/16/23.   Patient is aware of $30 copayment.

## 2023-03-16 NOTE — Telephone Encounter (Signed)
Oral Oncology Patient Advocate Encounter  PA updated to generic temozolomide, new CMM key [id number]  Status is pending.   Jinger Neighbors, CPhT-Adv Oncology Pharmacy Patient Advocate [facility] Direct Number: [phone]  Fax: [phone]

## 2023-03-16 NOTE — Telephone Encounter (Signed)
Oral Oncology Patient Advocate Encounter  Prior Authorization for temozolomide has been approved.    PA# [id number] Effective dates: 03/16/23 through 09/13/23  Patients co-pay is $15 for #5 of the 250mg  tablets. Patients copay is $15 for #5 of the 100mg  tablets.  Total cost to patient for 28 day cycle is $30.  Patient can return to filling at [facility].  Jinger Neighbors, CPhT-Adv Oncology Pharmacy Patient Advocate [facility] Direct Number: [phone]  Fax: [phone]

## 2023-03-17 ENCOUNTER — Other Ambulatory Visit: Payer: Self-pay

## 2023-03-21 ENCOUNTER — Other Ambulatory Visit: Payer: Self-pay

## 2023-03-30 LAB — GENECONNECT MOLECULAR SCREEN: Genetic Analysis Overall Interpretation: NEGATIVE

## 2023-04-01 ENCOUNTER — Other Ambulatory Visit: Payer: Self-pay

## 2023-04-04 ENCOUNTER — Other Ambulatory Visit: Payer: Self-pay | Admitting: Internal Medicine

## 2023-04-04 ENCOUNTER — Encounter: Payer: Self-pay | Admitting: Internal Medicine

## 2023-04-05 ENCOUNTER — Other Ambulatory Visit: Payer: Self-pay

## 2023-04-11 ENCOUNTER — Ambulatory Visit: Payer: No Typology Code available for payment source | Admitting: Internal Medicine

## 2023-04-11 ENCOUNTER — Other Ambulatory Visit: Payer: No Typology Code available for payment source

## 2023-04-11 ENCOUNTER — Other Ambulatory Visit: Payer: Self-pay

## 2023-04-12 ENCOUNTER — Other Ambulatory Visit: Payer: Self-pay

## 2023-04-15 ENCOUNTER — Other Ambulatory Visit: Payer: Self-pay | Admitting: Internal Medicine

## 2023-04-15 DIAGNOSIS — R569 Unspecified convulsions: Secondary | ICD-10-CM

## 2023-04-18 ENCOUNTER — Inpatient Hospital Stay: Attending: Radiation Oncology

## 2023-04-18 ENCOUNTER — Other Ambulatory Visit: Payer: Self-pay

## 2023-04-18 ENCOUNTER — Inpatient Hospital Stay: Attending: Radiation Oncology | Admitting: Internal Medicine

## 2023-04-18 ENCOUNTER — Other Ambulatory Visit (HOSPITAL_COMMUNITY): Payer: Self-pay

## 2023-04-18 VITALS — BP 127/77 | HR 70 | Temp 97.9°F | Resp 16 | Ht 68.0 in | Wt 148.0 lb

## 2023-04-18 DIAGNOSIS — Z79899 Other long term (current) drug therapy: Secondary | ICD-10-CM | POA: Diagnosis not present

## 2023-04-18 DIAGNOSIS — C719 Malignant neoplasm of brain, unspecified: Secondary | ICD-10-CM | POA: Diagnosis not present

## 2023-04-18 DIAGNOSIS — Z803 Family history of malignant neoplasm of breast: Secondary | ICD-10-CM | POA: Diagnosis not present

## 2023-04-18 DIAGNOSIS — R569 Unspecified convulsions: Secondary | ICD-10-CM | POA: Diagnosis not present

## 2023-04-18 DIAGNOSIS — C711 Malignant neoplasm of frontal lobe: Secondary | ICD-10-CM | POA: Diagnosis present

## 2023-04-18 DIAGNOSIS — Z79630 Long term (current) use of alkylating agent: Secondary | ICD-10-CM | POA: Diagnosis not present

## 2023-04-18 DIAGNOSIS — Z90710 Acquired absence of both cervix and uterus: Secondary | ICD-10-CM | POA: Diagnosis not present

## 2023-04-18 LAB — CMP (CANCER CENTER ONLY)
ALT: 13 U/L (ref 0–44)
AST: 16 U/L (ref 15–41)
Albumin: 4.7 g/dL (ref 3.5–5.0)
Alkaline Phosphatase: 115 U/L (ref 38–126)
Anion gap: 6 (ref 5–15)
BUN: 11 mg/dL (ref 6–20)
CO2: 28 mmol/L (ref 22–32)
Calcium: 9.3 mg/dL (ref 8.9–10.3)
Chloride: 108 mmol/L (ref 98–111)
Creatinine: 0.68 mg/dL (ref 0.44–1.00)
GFR, Estimated: >60 mL/min (ref >60)
Glucose, Bld: 79 mg/dL (ref 70–99)
Potassium: 4.2 mmol/L (ref 3.5–5.1)
Sodium: 142 mmol/L (ref 135–145)
Total Bilirubin: 0.4 mg/dL (ref 0.0–1.2)
Total Protein: 7.1 g/dL (ref 6.5–8.1)

## 2023-04-18 LAB — CBC WITH DIFFERENTIAL (CANCER CENTER ONLY)
Abs Immature Granulocytes: 0.01 10*3/uL (ref 0.00–0.07)
Basophils Absolute: 0.1 10*3/uL (ref 0.0–0.1)
Basophils Relative: 1 %
Eosinophils Absolute: 0.1 10*3/uL (ref 0.0–0.5)
Eosinophils Relative: 3 %
HCT: 36.2 % (ref 36.0–46.0)
Hemoglobin: 11.9 g/dL — ABNORMAL LOW (ref 12.0–15.0)
Immature Granulocytes: 0 %
Lymphocytes Relative: 17 %
Lymphs Abs: 0.8 10*3/uL (ref 0.7–4.0)
MCH: 30.8 pg (ref 26.0–34.0)
MCHC: 32.9 g/dL (ref 30.0–36.0)
MCV: 93.8 fL (ref 80.0–100.0)
Monocytes Absolute: 0.5 10*3/uL (ref 0.1–1.0)
Monocytes Relative: 11 %
Neutro Abs: 3 10*3/uL (ref 1.7–7.7)
Neutrophils Relative %: 68 %
Platelet Count: 260 10*3/uL (ref 150–400)
RBC: 3.86 MIL/uL — ABNORMAL LOW (ref 3.87–5.11)
RDW: 12.7 % (ref 11.5–15.5)
WBC Count: 4.4 10*3/uL (ref 4.0–10.5)
nRBC: 0 % (ref 0.0–0.2)

## 2023-04-18 MED ORDER — TEMOZOLOMIDE 250 MG PO CAPS
250.0000 mg | ORAL_CAPSULE | Freq: Every day | ORAL | 0 refills | Status: DC
Start: 2023-04-18 — End: 2023-06-21
  Filled 2023-04-18: qty 5, 28d supply, fill #0
  Filled 2023-04-18: qty 5, 5d supply, fill #0

## 2023-04-18 MED ORDER — TEMOZOLOMIDE 100 MG PO CAPS
100.0000 mg | ORAL_CAPSULE | Freq: Every day | ORAL | 0 refills | Status: DC
  Filled 2023-04-18: qty 5, 28d supply, fill #0
  Filled 2023-04-18: qty 5, 5d supply, fill #0

## 2023-04-18 MED ORDER — ONDANSETRON HCL 8 MG PO TABS
8.0000 mg | ORAL_TABLET | Freq: Three times a day (TID) | ORAL | 1 refills | Status: DC | PRN
  Filled 2023-04-18: qty 30, 10d supply, fill #0

## 2023-04-18 MED ORDER — ZONISAMIDE 100 MG PO CAPS: 100.0000 mg | ORAL_CAPSULE | Freq: Two times a day (BID) | ORAL | 3 refills | Status: DC
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
## 2023-04-18 NOTE — Progress Notes (Signed)
 Specialty Pharmacy Refill Coordination Note  Shirley Fisher is a 53 y.o. female contacted today regarding refills of specialty medication(s) Temozolomide [facility])   Patient requested Delivery   Delivery date: 04/20/23   Verified address: [address]   [place] [id number]   Medication will be filled on 04/19/23.

## 2023-04-18 NOTE — Progress Notes (Signed)
 Specialty Pharmacy Ongoing Clinical Assessment Note  Shirley Fisher is a 53 y.o. female who is being followed by the specialty pharmacy service for RxSp Oncology   Patient's specialty medication(s) reviewed today: Temozolomide (TEMODAR)   Missed doses in the last 4 weeks: 0   Patient/Caregiver did not have any additional questions or concerns.   Therapeutic benefit summary: Patient is achieving benefit   Adverse events/side effects summary: No adverse events/side effects   Patient's therapy is appropriate to: Continue    Goals Addressed             This Visit's Progress    Slow Disease Progression   On track    Patient is on track. Patient will maintain adherence.  Dr. Barbaraann Cao reported patient remains clinically stable at today's visit.          Follow up:  3 months  Otto Herb Specialty Pharmacist

## 2023-04-18 NOTE — Addendum Note (Signed)
 Addended by: Henreitta Leber on: 04/18/2023 12:34 PM   Modules accepted: Orders

## 2023-04-19 ENCOUNTER — Other Ambulatory Visit: Payer: Self-pay

## 2023-04-20 ENCOUNTER — Other Ambulatory Visit: Payer: Self-pay

## 2023-04-28 ENCOUNTER — Other Ambulatory Visit (HOSPITAL_COMMUNITY): Payer: Self-pay

## 2023-05-04 ENCOUNTER — Other Ambulatory Visit: Payer: Self-pay

## 2023-05-16 ENCOUNTER — Other Ambulatory Visit: Payer: Self-pay | Admitting: *Deleted

## 2023-05-16 ENCOUNTER — Other Ambulatory Visit: Payer: Self-pay

## 2023-05-16 ENCOUNTER — Inpatient Hospital Stay (HOSPITAL_BASED_OUTPATIENT_CLINIC_OR_DEPARTMENT_OTHER): Admitting: Internal Medicine

## 2023-05-16 ENCOUNTER — Inpatient Hospital Stay: Attending: Radiation Oncology

## 2023-05-16 VITALS — BP 124/87 | HR 82 | Temp 98.2°F | Resp 16 | Wt 147.6 lb

## 2023-05-16 DIAGNOSIS — Z79630 Long term (current) use of alkylating agent: Secondary | ICD-10-CM | POA: Insufficient documentation

## 2023-05-16 DIAGNOSIS — Z90710 Acquired absence of both cervix and uterus: Secondary | ICD-10-CM | POA: Diagnosis not present

## 2023-05-16 DIAGNOSIS — Z803 Family history of malignant neoplasm of breast: Secondary | ICD-10-CM | POA: Diagnosis not present

## 2023-05-16 DIAGNOSIS — R569 Unspecified convulsions: Secondary | ICD-10-CM

## 2023-05-16 DIAGNOSIS — C711 Malignant neoplasm of frontal lobe: Secondary | ICD-10-CM | POA: Insufficient documentation

## 2023-05-16 DIAGNOSIS — Z79899 Other long term (current) drug therapy: Secondary | ICD-10-CM | POA: Insufficient documentation

## 2023-05-16 DIAGNOSIS — C719 Malignant neoplasm of brain, unspecified: Secondary | ICD-10-CM

## 2023-05-16 DIAGNOSIS — Z923 Personal history of irradiation: Secondary | ICD-10-CM | POA: Diagnosis not present

## 2023-05-16 LAB — CBC WITH DIFFERENTIAL (CANCER CENTER ONLY)
Abs Immature Granulocytes: 0.01 10*3/uL (ref 0.00–0.07)
Basophils Absolute: 0 10*3/uL (ref 0.0–0.1)
Basophils Relative: 1 %
Eosinophils Absolute: 0.1 10*3/uL (ref 0.0–0.5)
Eosinophils Relative: 2 %
HCT: 35.9 % — ABNORMAL LOW (ref 36.0–46.0)
Hemoglobin: 12 g/dL (ref 12.0–15.0)
Immature Granulocytes: 0 %
Lymphocytes Relative: 17 %
Lymphs Abs: 0.6 10*3/uL — ABNORMAL LOW (ref 0.7–4.0)
MCH: 31.4 pg (ref 26.0–34.0)
MCHC: 33.4 g/dL (ref 30.0–36.0)
MCV: 94 fL (ref 80.0–100.0)
Monocytes Absolute: 0.4 10*3/uL (ref 0.1–1.0)
Monocytes Relative: 11 %
Neutro Abs: 2.3 10*3/uL (ref 1.7–7.7)
Neutrophils Relative %: 69 %
Platelet Count: 245 10*3/uL (ref 150–400)
RBC: 3.82 MIL/uL — ABNORMAL LOW (ref 3.87–5.11)
RDW: 12.6 % (ref 11.5–15.5)
WBC Count: 3.4 10*3/uL — ABNORMAL LOW (ref 4.0–10.5)
nRBC: 0 % (ref 0.0–0.2)

## 2023-05-16 LAB — CMP (CANCER CENTER ONLY)
ALT: 13 U/L (ref 0–44)
AST: 17 U/L (ref 15–41)
Albumin: 4.6 g/dL (ref 3.5–5.0)
Alkaline Phosphatase: 109 U/L (ref 38–126)
Anion gap: 6 (ref 5–15)
BUN: 16 mg/dL (ref 6–20)
CO2: 27 mmol/L (ref 22–32)
Calcium: 9.6 mg/dL (ref 8.9–10.3)
Chloride: 107 mmol/L (ref 98–111)
Creatinine: 0.69 mg/dL (ref 0.44–1.00)
GFR, Estimated: >60 mL/min (ref >60)
Glucose, Bld: 92 mg/dL (ref 70–99)
Potassium: 4 mmol/L (ref 3.5–5.1)
Sodium: 140 mmol/L (ref 135–145)
Total Bilirubin: 0.4 mg/dL (ref 0.0–1.2)
Total Protein: 7.1 g/dL (ref 6.5–8.1)

## 2023-05-16 MED ORDER — TEMOZOLOMIDE 250 MG PO CAPS
250.0000 mg | ORAL_CAPSULE | Freq: Every day | ORAL | 0 refills | Status: DC
Start: 2023-05-16 — End: 2023-06-21
  Filled 2023-05-16: qty 5, 5d supply, fill #0
  Filled 2023-05-18: qty 5, 28d supply, fill #0

## 2023-05-16 MED ORDER — TEMOZOLOMIDE 100 MG PO CAPS
100.0000 mg | ORAL_CAPSULE | Freq: Every day | ORAL | 0 refills | Status: DC
  Filled 2023-05-16: qty 14, 14d supply, fill #0
  Filled 2023-05-18: qty 5, 28d supply, fill #0
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
## 2023-05-17 ENCOUNTER — Other Ambulatory Visit: Payer: Self-pay

## 2023-05-18 ENCOUNTER — Other Ambulatory Visit: Payer: Self-pay

## 2023-05-18 ENCOUNTER — Telehealth: Payer: Self-pay | Admitting: Internal Medicine

## 2023-05-18 NOTE — Progress Notes (Signed)
 Specialty Pharmacy Refill Coordination Note  Shirley Fisher is a 53 y.o. female contacted today regarding refills of specialty medication(s) Temozolomide [facility])   Patient requested Delivery   Delivery date: 05/19/23   Verified address: [address]   [place] [id number]   Medication will be filled on 04.16.25 or 04.17.25.

## 2023-05-18 NOTE — Telephone Encounter (Signed)
 Scheduled patient's appts. Advised patient to contact us if rescheduling is needed. Provided my direct line.

## 2023-05-19 ENCOUNTER — Other Ambulatory Visit: Payer: Self-pay

## 2023-05-22 ENCOUNTER — Encounter: Payer: Self-pay | Admitting: Internal Medicine

## 2023-05-23 ENCOUNTER — Other Ambulatory Visit: Payer: Self-pay | Admitting: *Deleted

## 2023-05-23 DIAGNOSIS — C719 Malignant neoplasm of brain, unspecified: Secondary | ICD-10-CM

## 2023-05-23 MED ORDER — ONDANSETRON HCL 8 MG PO TABS: 8.0000 mg | ORAL_TABLET | Freq: Three times a day (TID) | ORAL | 1 refills | Status: DC | PRN

## 2023-06-10 ENCOUNTER — Other Ambulatory Visit: Payer: Self-pay | Admitting: *Deleted

## 2023-06-10 DIAGNOSIS — C719 Malignant neoplasm of brain, unspecified: Secondary | ICD-10-CM

## 2023-06-13 ENCOUNTER — Inpatient Hospital Stay

## 2023-06-13 ENCOUNTER — Other Ambulatory Visit: Payer: Self-pay | Admitting: *Deleted

## 2023-06-13 ENCOUNTER — Inpatient Hospital Stay: Admitting: Internal Medicine

## 2023-06-13 DIAGNOSIS — C719 Malignant neoplasm of brain, unspecified: Secondary | ICD-10-CM

## 2023-06-13 NOTE — Progress Notes (Signed)
 Patient needs to have scan done prior to seeing Dr Mark Sil.  Ordered scan and canceled todays visit.

## 2023-06-14 ENCOUNTER — Other Ambulatory Visit: Payer: Self-pay

## 2023-06-15 ENCOUNTER — Telehealth: Payer: Self-pay | Admitting: Internal Medicine

## 2023-06-15 ENCOUNTER — Encounter: Payer: Self-pay | Admitting: Internal Medicine

## 2023-06-15 NOTE — Telephone Encounter (Signed)
 Shirley Fisher scheduled her appointments and she is aware of all appointment details.

## 2023-06-16 ENCOUNTER — Other Ambulatory Visit: Payer: Self-pay

## 2023-06-17 ENCOUNTER — Ambulatory Visit (HOSPITAL_COMMUNITY)
Admission: RE | Admit: 2023-06-17 | Discharge: 2023-06-17 | Disposition: A | Discharge status: Home / Self Care | Source: Ambulatory Visit | Attending: Internal Medicine | Admitting: Internal Medicine

## 2023-06-17 DIAGNOSIS — C719 Malignant neoplasm of brain, unspecified: Secondary | ICD-10-CM | POA: Diagnosis present

## 2023-06-17 MED ORDER — GADOBUTROL 1 MMOL/ML IV SOLN
7.0000 mL | Freq: Once | INTRAVENOUS | Status: AC | PRN
  Administered 2023-06-17: 7 mL via INTRAVENOUS

## 2023-06-20 ENCOUNTER — Other Ambulatory Visit: Payer: Self-pay

## 2023-06-21 ENCOUNTER — Inpatient Hospital Stay: Attending: Radiation Oncology | Admitting: Internal Medicine

## 2023-06-21 ENCOUNTER — Other Ambulatory Visit: Payer: Self-pay

## 2023-06-21 ENCOUNTER — Inpatient Hospital Stay

## 2023-06-21 ENCOUNTER — Other Ambulatory Visit: Payer: Self-pay | Admitting: Radiation Therapy

## 2023-06-21 ENCOUNTER — Other Ambulatory Visit (HOSPITAL_COMMUNITY): Payer: Self-pay

## 2023-06-21 VITALS — BP 147/92 | HR 81 | Temp 97.3°F | Resp 81 | Wt 149.4 lb

## 2023-06-21 DIAGNOSIS — Z923 Personal history of irradiation: Secondary | ICD-10-CM | POA: Insufficient documentation

## 2023-06-21 DIAGNOSIS — C719 Malignant neoplasm of brain, unspecified: Secondary | ICD-10-CM

## 2023-06-21 DIAGNOSIS — Z9221 Personal history of antineoplastic chemotherapy: Secondary | ICD-10-CM | POA: Diagnosis not present

## 2023-06-21 DIAGNOSIS — Z79899 Other long term (current) drug therapy: Secondary | ICD-10-CM | POA: Insufficient documentation

## 2023-06-21 DIAGNOSIS — R569 Unspecified convulsions: Secondary | ICD-10-CM

## 2023-06-21 DIAGNOSIS — C711 Malignant neoplasm of frontal lobe: Secondary | ICD-10-CM | POA: Diagnosis present

## 2023-06-21 DIAGNOSIS — Z79630 Long term (current) use of alkylating agent: Secondary | ICD-10-CM | POA: Insufficient documentation

## 2023-06-21 LAB — CBC WITH DIFFERENTIAL (CANCER CENTER ONLY)
Abs Immature Granulocytes: 0.02 10*3/uL (ref 0.00–0.07)
Basophils Absolute: 0 10*3/uL (ref 0.0–0.1)
Basophils Relative: 1 %
Eosinophils Absolute: 0.1 10*3/uL (ref 0.0–0.5)
Eosinophils Relative: 3 %
HCT: 34.7 % — ABNORMAL LOW (ref 36.0–46.0)
Hemoglobin: 11.8 g/dL — ABNORMAL LOW (ref 12.0–15.0)
Immature Granulocytes: 1 %
Lymphocytes Relative: 18 %
Lymphs Abs: 0.7 10*3/uL (ref 0.7–4.0)
MCH: 32.1 pg (ref 26.0–34.0)
MCHC: 34 g/dL (ref 30.0–36.0)
MCV: 94.3 fL (ref 80.0–100.0)
Monocytes Absolute: 0.6 10*3/uL (ref 0.1–1.0)
Monocytes Relative: 14 %
Neutro Abs: 2.6 10*3/uL (ref 1.7–7.7)
Neutrophils Relative %: 63 %
Platelet Count: 249 10*3/uL (ref 150–400)
RBC: 3.68 MIL/uL — ABNORMAL LOW (ref 3.87–5.11)
RDW: 12.5 % (ref 11.5–15.5)
WBC Count: 4 10*3/uL (ref 4.0–10.5)
nRBC: 0 % (ref 0.0–0.2)

## 2023-06-21 LAB — CMP (CANCER CENTER ONLY)
ALT: 14 U/L (ref 0–44)
AST: 18 U/L (ref 15–41)
Albumin: 4.7 g/dL (ref 3.5–5.0)
Alkaline Phosphatase: 108 U/L (ref 38–126)
Anion gap: 6 (ref 5–15)
BUN: 14 mg/dL (ref 6–20)
CO2: 28 mmol/L (ref 22–32)
Calcium: 9.3 mg/dL (ref 8.9–10.3)
Chloride: 107 mmol/L (ref 98–111)
Creatinine: 0.75 mg/dL (ref 0.44–1.00)
GFR, Estimated: >60 mL/min (ref >60)
Glucose, Bld: 62 mg/dL — ABNORMAL LOW (ref 70–99)
Potassium: 3.6 mmol/L (ref 3.5–5.1)
Sodium: 141 mmol/L (ref 135–145)
Total Bilirubin: 0.4 mg/dL (ref 0.0–1.2)
Total Protein: 7.2 g/dL (ref 6.5–8.1)

## 2023-06-21 MED ORDER — TEMOZOLOMIDE 100 MG PO CAPS
100.0000 mg | ORAL_CAPSULE | Freq: Every day | ORAL | 0 refills | Status: DC
  Filled 2023-06-21: qty 5, 28d supply, fill #0
  Filled 2023-06-21: qty 5, 5d supply, fill #0

## 2023-06-21 MED ORDER — TEMOZOLOMIDE 250 MG PO CAPS
250.0000 mg | ORAL_CAPSULE | Freq: Every day | ORAL | 0 refills | Status: DC
  Filled 2023-06-21: qty 5, 5d supply, fill #0
  Filled 2023-06-21: qty 5, 28d supply, fill #0
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
## 2023-06-21 NOTE — Progress Notes (Signed)
 Specialty Pharmacy Refill Coordination Note  Shirley Fisher is a 53 y.o. female contacted today regarding refills of specialty medication(s) Temozolomide  (TEMODAR )   Patient requested Delivery   Delivery date: 06/23/23   Verified address: [address]   [place] [address]   Medication will be filled on 05.21.25.

## 2023-06-22 ENCOUNTER — Other Ambulatory Visit (HOSPITAL_COMMUNITY): Payer: Self-pay

## 2023-06-22 ENCOUNTER — Other Ambulatory Visit: Payer: Self-pay

## 2023-06-24 ENCOUNTER — Encounter: Payer: Self-pay | Admitting: Internal Medicine

## 2023-06-29 ENCOUNTER — Other Ambulatory Visit: Payer: Self-pay | Admitting: Internal Medicine

## 2023-06-29 MED ORDER — NAYZILAM 5 MG/0.1ML NA SOLN: 5.0000 mg | Freq: Four times a day (QID) | NASAL | 0 refills | Status: DC | PRN

## 2023-07-05 ENCOUNTER — Encounter: Payer: Self-pay | Admitting: Internal Medicine

## 2023-07-05 ENCOUNTER — Other Ambulatory Visit: Payer: Self-pay | Admitting: *Deleted

## 2023-07-05 DIAGNOSIS — R569 Unspecified convulsions: Secondary | ICD-10-CM

## 2023-07-05 MED ORDER — LAMOTRIGINE 100 MG PO TABS
ORAL_TABLET | ORAL | 0 refills | Status: DC
Start: 2023-07-05 — End: 2023-10-04

## 2023-07-07 ENCOUNTER — Other Ambulatory Visit (HOSPITAL_COMMUNITY): Payer: Self-pay

## 2023-07-07 NOTE — Progress Notes (Signed)
 Specialty Pharmacy Ongoing Clinical Assessment Note  Shirley Fisher is a 53 y.o. female who is being followed by the specialty pharmacy service for RxSp Oncology   Patient's specialty medication(s) reviewed today: Temozolomide  (TEMODAR )   Missed doses in the last 4 weeks: 0   Patient/Caregiver did not have any additional questions or concerns.   Therapeutic benefit summary: Patient is achieving benefit   Adverse events/side effects summary: Experienced adverse events/side effects (nausea, well-controlled by pre-treating with Zofran )   Patient's therapy is appropriate to: Continue    Goals Addressed             This Visit's Progress    Slow Disease Progression   On track    Patient is on track. Patient will maintain adherence.  Dr. Mark Sil reported patient remains clinically stable on 06/21/23.          Follow up: 6 months  Malachi Screws Specialty Pharmacist

## 2023-07-12 ENCOUNTER — Other Ambulatory Visit: Payer: Self-pay

## 2023-07-18 ENCOUNTER — Other Ambulatory Visit

## 2023-07-18 ENCOUNTER — Ambulatory Visit: Admitting: Internal Medicine

## 2023-07-18 ENCOUNTER — Ambulatory Visit (HOSPITAL_COMMUNITY)
Admission: RE | Admit: 2023-07-18 | Discharge: 2023-07-18 | Disposition: A | Discharge status: Home / Self Care | Source: Ambulatory Visit | Attending: Internal Medicine | Admitting: Internal Medicine

## 2023-07-18 DIAGNOSIS — C719 Malignant neoplasm of brain, unspecified: Secondary | ICD-10-CM | POA: Diagnosis present

## 2023-07-18 LAB — GLUCOSE, CAPILLARY: Glucose-Capillary: 88 mg/dL (ref 70–99)

## 2023-07-18 MED ORDER — FLUDEOXYGLUCOSE F - 18 (FDG) INJECTION
9.9600 | Freq: Once | INTRAVENOUS | Status: AC
  Administered 2023-07-18: 9.96 via INTRAVENOUS

## 2023-07-19 ENCOUNTER — Encounter (HOSPITAL_COMMUNITY): Payer: Self-pay

## 2023-07-19 ENCOUNTER — Ambulatory Visit (HOSPITAL_COMMUNITY)

## 2023-07-20 ENCOUNTER — Encounter: Payer: Self-pay | Admitting: Internal Medicine

## 2023-07-20 ENCOUNTER — Other Ambulatory Visit: Payer: Self-pay | Admitting: *Deleted

## 2023-07-20 DIAGNOSIS — C719 Malignant neoplasm of brain, unspecified: Secondary | ICD-10-CM

## 2023-07-21 ENCOUNTER — Other Ambulatory Visit: Payer: Self-pay

## 2023-07-21 ENCOUNTER — Inpatient Hospital Stay: Attending: Radiation Oncology

## 2023-07-21 ENCOUNTER — Inpatient Hospital Stay (HOSPITAL_BASED_OUTPATIENT_CLINIC_OR_DEPARTMENT_OTHER): Admitting: Internal Medicine

## 2023-07-21 ENCOUNTER — Other Ambulatory Visit (HOSPITAL_COMMUNITY): Payer: Self-pay

## 2023-07-21 ENCOUNTER — Other Ambulatory Visit (HOSPITAL_BASED_OUTPATIENT_CLINIC_OR_DEPARTMENT_OTHER): Payer: Self-pay

## 2023-07-21 VITALS — BP 129/88 | HR 75 | Temp 98.1°F | Resp 20 | Wt 149.0 lb

## 2023-07-21 DIAGNOSIS — C711 Malignant neoplasm of frontal lobe: Secondary | ICD-10-CM

## 2023-07-21 DIAGNOSIS — R569 Unspecified convulsions: Secondary | ICD-10-CM | POA: Insufficient documentation

## 2023-07-21 DIAGNOSIS — Z79899 Other long term (current) drug therapy: Secondary | ICD-10-CM | POA: Insufficient documentation

## 2023-07-21 DIAGNOSIS — Z803 Family history of malignant neoplasm of breast: Secondary | ICD-10-CM | POA: Diagnosis not present

## 2023-07-21 DIAGNOSIS — C719 Malignant neoplasm of brain, unspecified: Secondary | ICD-10-CM

## 2023-07-21 LAB — CBC WITH DIFFERENTIAL (CANCER CENTER ONLY)
Abs Immature Granulocytes: 0.02 10*3/uL (ref 0.00–0.07)
Basophils Absolute: 0 10*3/uL (ref 0.0–0.1)
Basophils Relative: 1 %
Eosinophils Absolute: 0.1 10*3/uL (ref 0.0–0.5)
Eosinophils Relative: 2 %
HCT: 34.1 % — ABNORMAL LOW (ref 36.0–46.0)
Hemoglobin: 11.5 g/dL — ABNORMAL LOW (ref 12.0–15.0)
Immature Granulocytes: 1 %
Lymphocytes Relative: 19 %
Lymphs Abs: 0.8 10*3/uL (ref 0.7–4.0)
MCH: 31.9 pg (ref 26.0–34.0)
MCHC: 33.7 g/dL (ref 30.0–36.0)
MCV: 94.5 fL (ref 80.0–100.0)
Monocytes Absolute: 0.5 10*3/uL (ref 0.1–1.0)
Monocytes Relative: 12 %
Neutro Abs: 2.9 10*3/uL (ref 1.7–7.7)
Neutrophils Relative %: 65 %
Platelet Count: 269 10*3/uL (ref 150–400)
RBC: 3.61 MIL/uL — ABNORMAL LOW (ref 3.87–5.11)
RDW: 12.4 % (ref 11.5–15.5)
WBC Count: 4.3 10*3/uL (ref 4.0–10.5)
nRBC: 0 % (ref 0.0–0.2)

## 2023-07-21 LAB — CMP (CANCER CENTER ONLY)
ALT: 16 U/L (ref 0–44)
AST: 21 U/L (ref 15–41)
Albumin: 4.7 g/dL (ref 3.5–5.0)
Alkaline Phosphatase: 112 U/L (ref 38–126)
Anion gap: 7 (ref 5–15)
BUN: 15 mg/dL (ref 6–20)
CO2: 28 mmol/L (ref 22–32)
Calcium: 9.4 mg/dL (ref 8.9–10.3)
Chloride: 106 mmol/L (ref 98–111)
Creatinine: 0.74 mg/dL (ref 0.44–1.00)
GFR, Estimated: >60 mL/min (ref >60)
Glucose, Bld: 95 mg/dL (ref 70–99)
Potassium: 4.6 mmol/L (ref 3.5–5.1)
Sodium: 141 mmol/L (ref 135–145)
Total Bilirubin: 0.4 mg/dL (ref 0.0–1.2)
Total Protein: 7.1 g/dL (ref 6.5–8.1)

## 2023-07-21 MED ORDER — TEMOZOLOMIDE 250 MG PO CAPS
250.0000 mg | ORAL_CAPSULE | Freq: Every day | ORAL | 0 refills | Status: DC
  Filled 2023-07-21: qty 5, 5d supply, fill #0

## 2023-07-21 MED ORDER — ZONISAMIDE 100 MG PO CAPS: 100.0000 mg | ORAL_CAPSULE | Freq: Two times a day (BID) | ORAL | 3 refills | Status: DC

## 2023-07-21 MED ORDER — ONDANSETRON HCL 8 MG PO TABS
8.0000 mg | ORAL_TABLET | Freq: Three times a day (TID) | ORAL | 1 refills | Status: AC | PRN
Start: 2023-07-21 — End: ?
  Filled 2023-07-21: qty 30, 10d supply, fill #0

## 2023-07-21 MED ORDER — TEMOZOLOMIDE 100 MG PO CAPS
100.0000 mg | ORAL_CAPSULE | Freq: Every day | ORAL | 0 refills | Status: DC
  Filled 2023-07-21: qty 5, 5d supply, fill #0

## 2023-07-21 NOTE — Progress Notes (Signed)
 Specialty Pharmacy Refill Coordination Note  Shirley Fisher is a 53 y.o. female contacted today regarding refills of specialty medication(s) Temozolomide  (TEMODAR )   Patient requested Delivery   Delivery date: 07/22/23   Verified address: [address]   [place] [phone]   Medication will be filled on 06.19.25.
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
## 2023-07-25 ENCOUNTER — Encounter

## 2023-07-25 ENCOUNTER — Other Ambulatory Visit: Payer: Self-pay | Admitting: *Deleted

## 2023-07-25 DIAGNOSIS — C719 Malignant neoplasm of brain, unspecified: Secondary | ICD-10-CM

## 2023-08-05 ENCOUNTER — Other Ambulatory Visit: Payer: Self-pay

## 2023-08-08 ENCOUNTER — Other Ambulatory Visit: Payer: Self-pay

## 2023-08-09 ENCOUNTER — Other Ambulatory Visit (HOSPITAL_COMMUNITY): Payer: Self-pay

## 2023-08-12 ENCOUNTER — Other Ambulatory Visit: Payer: Self-pay

## 2023-08-17 ENCOUNTER — Telehealth: Payer: Self-pay | Admitting: Internal Medicine

## 2023-08-25 ENCOUNTER — Ambulatory Visit: Admitting: Internal Medicine

## 2023-08-25 ENCOUNTER — Other Ambulatory Visit: Payer: Self-pay

## 2023-08-25 ENCOUNTER — Other Ambulatory Visit

## 2023-08-29 ENCOUNTER — Other Ambulatory Visit: Payer: Self-pay | Admitting: Pharmacy Technician

## 2023-08-29 ENCOUNTER — Inpatient Hospital Stay: Attending: Radiation Oncology

## 2023-08-29 ENCOUNTER — Inpatient Hospital Stay (HOSPITAL_BASED_OUTPATIENT_CLINIC_OR_DEPARTMENT_OTHER): Admitting: Internal Medicine

## 2023-08-29 ENCOUNTER — Other Ambulatory Visit: Payer: Self-pay

## 2023-08-29 ENCOUNTER — Other Ambulatory Visit (HOSPITAL_COMMUNITY): Payer: Self-pay

## 2023-08-29 ENCOUNTER — Encounter: Payer: Self-pay | Admitting: Internal Medicine

## 2023-08-29 ENCOUNTER — Other Ambulatory Visit: Payer: Self-pay | Admitting: *Deleted

## 2023-08-29 VITALS — BP 113/85 | HR 74 | Temp 98.2°F | Resp 18 | Wt 147.7 lb

## 2023-08-29 DIAGNOSIS — C719 Malignant neoplasm of brain, unspecified: Secondary | ICD-10-CM

## 2023-08-29 DIAGNOSIS — C711 Malignant neoplasm of frontal lobe: Secondary | ICD-10-CM | POA: Diagnosis present

## 2023-08-29 DIAGNOSIS — Z923 Personal history of irradiation: Secondary | ICD-10-CM | POA: Insufficient documentation

## 2023-08-29 DIAGNOSIS — Z79899 Other long term (current) drug therapy: Secondary | ICD-10-CM | POA: Diagnosis not present

## 2023-08-29 DIAGNOSIS — Z803 Family history of malignant neoplasm of breast: Secondary | ICD-10-CM | POA: Diagnosis not present

## 2023-08-29 DIAGNOSIS — R569 Unspecified convulsions: Secondary | ICD-10-CM | POA: Insufficient documentation

## 2023-08-29 LAB — CMP (CANCER CENTER ONLY)
ALT: 13 U/L (ref 0–44)
AST: 16 U/L (ref 15–41)
Albumin: 4.4 g/dL (ref 3.5–5.0)
Alkaline Phosphatase: 109 U/L (ref 38–126)
Anion gap: 5 (ref 5–15)
BUN: 14 mg/dL (ref 6–20)
CO2: 29 mmol/L (ref 22–32)
Calcium: 9.4 mg/dL (ref 8.9–10.3)
Chloride: 107 mmol/L (ref 98–111)
Creatinine: 0.74 mg/dL (ref 0.44–1.00)
GFR, Estimated: >60 mL/min (ref >60)
Glucose, Bld: 86 mg/dL (ref 70–99)
Potassium: 4 mmol/L (ref 3.5–5.1)
Sodium: 141 mmol/L (ref 135–145)
Total Bilirubin: 0.3 mg/dL (ref 0.0–1.2)
Total Protein: 7.2 g/dL (ref 6.5–8.1)

## 2023-08-29 LAB — CBC WITH DIFFERENTIAL (CANCER CENTER ONLY)
Abs Immature Granulocytes: 0.02 K/uL (ref 0.00–0.07)
Basophils Absolute: 0.1 K/uL (ref 0.0–0.1)
Basophils Relative: 1 %
Eosinophils Absolute: 0.3 K/uL (ref 0.0–0.5)
Eosinophils Relative: 5 %
HCT: 35.6 % — ABNORMAL LOW (ref 36.0–46.0)
Hemoglobin: 12 g/dL (ref 12.0–15.0)
Immature Granulocytes: 0 %
Lymphocytes Relative: 15 %
Lymphs Abs: 0.7 K/uL (ref 0.7–4.0)
MCH: 32.2 pg (ref 26.0–34.0)
MCHC: 33.7 g/dL (ref 30.0–36.0)
MCV: 95.4 fL (ref 80.0–100.0)
Monocytes Absolute: 0.6 K/uL (ref 0.1–1.0)
Monocytes Relative: 11 %
Neutro Abs: 3.5 K/uL (ref 1.7–7.7)
Neutrophils Relative %: 68 %
Platelet Count: 256 K/uL (ref 150–400)
RBC: 3.73 MIL/uL — ABNORMAL LOW (ref 3.87–5.11)
RDW: 12.2 % (ref 11.5–15.5)
WBC Count: 5.1 K/uL (ref 4.0–10.5)
nRBC: 0 % (ref 0.0–0.2)

## 2023-08-29 MED ORDER — TEMOZOLOMIDE 250 MG PO CAPS
250.0000 mg | ORAL_CAPSULE | Freq: Every day | ORAL | 0 refills | Status: DC
  Filled 2023-08-29: qty 5, 28d supply, fill #0

## 2023-08-29 MED ORDER — TEMOZOLOMIDE 100 MG PO CAPS
100.0000 mg | ORAL_CAPSULE | Freq: Every day | ORAL | 0 refills | Status: DC
  Filled 2023-08-29: qty 5, 28d supply, fill #0

## 2023-08-29 MED ORDER — NAYZILAM 5 MG/0.1ML NA SOLN
5.0000 mg | Freq: Four times a day (QID) | NASAL | 0 refills | Status: DC | PRN
  Filled 2023-08-29: qty 1, 30d supply, fill #0

## 2023-08-29 NOTE — Progress Notes (Signed)
 Specialty Pharmacy Refill Coordination Note  Shirley Fisher is a 53 y.o. female contacted today regarding refills of specialty medication(s) Temozolomide  (TEMODAR )   Patient requested Delivery   Delivery date: 08/30/23   Verified address: [address] [place] [id number]   Medication will be filled on 08/29/23.
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
## 2023-09-01 ENCOUNTER — Telehealth: Payer: Self-pay | Admitting: Internal Medicine

## 2023-09-01 NOTE — Telephone Encounter (Signed)
 Scheduled appointments per 7/28 los. Talked with the patient and she is aware of the made appointments.

## 2023-09-02 ENCOUNTER — Other Ambulatory Visit: Payer: Self-pay

## 2023-09-04 ENCOUNTER — Encounter: Payer: Self-pay | Admitting: Internal Medicine

## 2023-09-05 ENCOUNTER — Other Ambulatory Visit: Payer: Self-pay | Admitting: Internal Medicine

## 2023-09-05 ENCOUNTER — Other Ambulatory Visit: Payer: Self-pay | Admitting: *Deleted

## 2023-09-05 DIAGNOSIS — R569 Unspecified convulsions: Secondary | ICD-10-CM

## 2023-09-05 MED ORDER — NAYZILAM 5 MG/0.1ML NA SOLN: 5.0000 mg | Freq: Four times a day (QID) | NASAL | 0 refills | Status: DC | PRN

## 2023-09-07 NOTE — Telephone Encounter (Signed)
 This is controlled and Dr. Buckley will have to sign this. Andrea CHRISTELLA Plunk, RN

## 2023-09-09 ENCOUNTER — Encounter: Payer: Self-pay | Admitting: Internal Medicine

## 2023-09-15 ENCOUNTER — Other Ambulatory Visit: Payer: Self-pay

## 2023-09-20 ENCOUNTER — Other Ambulatory Visit: Payer: Self-pay | Admitting: Physician Assistant

## 2023-09-20 DIAGNOSIS — Z1231 Encounter for screening mammogram for malignant neoplasm of breast: Secondary | ICD-10-CM

## 2023-09-21 ENCOUNTER — Ambulatory Visit (HOSPITAL_COMMUNITY)
Admission: RE | Admit: 2023-09-21 | Discharge: 2023-09-21 | Disposition: A | Discharge status: Home / Self Care | Source: Ambulatory Visit | Attending: Internal Medicine | Admitting: Internal Medicine

## 2023-09-21 ENCOUNTER — Other Ambulatory Visit: Payer: Self-pay

## 2023-09-21 DIAGNOSIS — C719 Malignant neoplasm of brain, unspecified: Secondary | ICD-10-CM | POA: Diagnosis present

## 2023-09-21 MED ORDER — GADOBUTROL 1 MMOL/ML IV SOLN
6.0000 mL | Freq: Once | INTRAVENOUS | Status: AC | PRN
  Administered 2023-09-21: 6 mL via INTRAVENOUS

## 2023-09-26 ENCOUNTER — Other Ambulatory Visit: Payer: Self-pay

## 2023-09-26 ENCOUNTER — Inpatient Hospital Stay: Attending: Radiation Oncology

## 2023-09-26 ENCOUNTER — Telehealth: Payer: Self-pay | Admitting: Internal Medicine

## 2023-09-26 ENCOUNTER — Inpatient Hospital Stay (HOSPITAL_BASED_OUTPATIENT_CLINIC_OR_DEPARTMENT_OTHER): Admitting: Internal Medicine

## 2023-09-26 VITALS — BP 134/89 | HR 73 | Resp 20 | Wt 146.9 lb

## 2023-09-26 DIAGNOSIS — C711 Malignant neoplasm of frontal lobe: Secondary | ICD-10-CM | POA: Insufficient documentation

## 2023-09-26 DIAGNOSIS — Z79899 Other long term (current) drug therapy: Secondary | ICD-10-CM | POA: Insufficient documentation

## 2023-09-26 DIAGNOSIS — C719 Malignant neoplasm of brain, unspecified: Secondary | ICD-10-CM | POA: Diagnosis not present

## 2023-09-26 DIAGNOSIS — R569 Unspecified convulsions: Secondary | ICD-10-CM | POA: Diagnosis not present

## 2023-09-26 LAB — CBC WITH DIFFERENTIAL (CANCER CENTER ONLY)
Abs Immature Granulocytes: 0.01 K/uL (ref 0.00–0.07)
Basophils Absolute: 0 K/uL (ref 0.0–0.1)
Basophils Relative: 1 %
Eosinophils Absolute: 0.1 K/uL (ref 0.0–0.5)
Eosinophils Relative: 3 %
HCT: 34.7 % — ABNORMAL LOW (ref 36.0–46.0)
Hemoglobin: 11.7 g/dL — ABNORMAL LOW (ref 12.0–15.0)
Immature Granulocytes: 0 %
Lymphocytes Relative: 18 %
Lymphs Abs: 0.7 K/uL (ref 0.7–4.0)
MCH: 32.1 pg (ref 26.0–34.0)
MCHC: 33.7 g/dL (ref 30.0–36.0)
MCV: 95.3 fL (ref 80.0–100.0)
Monocytes Absolute: 0.5 K/uL (ref 0.1–1.0)
Monocytes Relative: 13 %
Neutro Abs: 2.3 K/uL (ref 1.7–7.7)
Neutrophils Relative %: 65 %
Platelet Count: 268 K/uL (ref 150–400)
RBC: 3.64 MIL/uL — ABNORMAL LOW (ref 3.87–5.11)
RDW: 12.1 % (ref 11.5–15.5)
WBC Count: 3.6 K/uL — ABNORMAL LOW (ref 4.0–10.5)
nRBC: 0 % (ref 0.0–0.2)

## 2023-09-26 LAB — CMP (CANCER CENTER ONLY)
ALT: 12 U/L (ref 0–44)
AST: 17 U/L (ref 15–41)
Albumin: 4.6 g/dL (ref 3.5–5.0)
Alkaline Phosphatase: 105 U/L (ref 38–126)
Anion gap: 5 (ref 5–15)
BUN: 14 mg/dL (ref 6–20)
CO2: 29 mmol/L (ref 22–32)
Calcium: 9.3 mg/dL (ref 8.9–10.3)
Chloride: 107 mmol/L (ref 98–111)
Creatinine: 0.67 mg/dL (ref 0.44–1.00)
GFR, Estimated: >60 mL/min
Glucose, Bld: 68 mg/dL — ABNORMAL LOW (ref 70–99)
Potassium: 3.5 mmol/L (ref 3.5–5.1)
Sodium: 141 mmol/L (ref 135–145)
Total Bilirubin: 0.4 mg/dL (ref 0.0–1.2)
Total Protein: 6.8 g/dL (ref 6.5–8.1)

## 2023-09-26 NOTE — Telephone Encounter (Signed)
 Scheduled appointment per 8/25 los. Called and left VM with appointment details for the patient.

## 2023-09-26 NOTE — Progress Notes (Signed)
 Disenrolling - per 8.25.25 office visit note patient is stopping temodar  and transition to close imaging surveillance only.
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
## 2023-09-27 ENCOUNTER — Other Ambulatory Visit: Payer: Self-pay

## 2023-10-04 ENCOUNTER — Other Ambulatory Visit: Payer: Self-pay | Admitting: Internal Medicine

## 2023-10-04 DIAGNOSIS — R569 Unspecified convulsions: Secondary | ICD-10-CM

## 2023-10-19 ENCOUNTER — Encounter: Payer: Self-pay | Admitting: Internal Medicine

## 2023-10-19 ENCOUNTER — Other Ambulatory Visit: Payer: Self-pay | Admitting: Nurse Practitioner

## 2023-10-19 DIAGNOSIS — R569 Unspecified convulsions: Secondary | ICD-10-CM

## 2023-10-19 MED ORDER — NAYZILAM 5 MG/0.1ML NA SOLN: 5.0000 mg | Freq: Four times a day (QID) | NASAL | 0 refills | Status: AC | PRN

## 2023-10-31 ENCOUNTER — Ambulatory Visit

## 2023-11-22 ENCOUNTER — Ambulatory Visit (HOSPITAL_COMMUNITY)

## 2023-11-22 ENCOUNTER — Encounter: Payer: Self-pay | Admitting: Internal Medicine

## 2023-11-23 ENCOUNTER — Other Ambulatory Visit: Payer: Self-pay | Admitting: *Deleted

## 2023-11-23 ENCOUNTER — Encounter: Payer: Self-pay | Admitting: Internal Medicine

## 2023-11-23 DIAGNOSIS — R569 Unspecified convulsions: Secondary | ICD-10-CM

## 2023-11-23 MED ORDER — ZONISAMIDE 100 MG PO CAPS: 100.0000 mg | ORAL_CAPSULE | Freq: Two times a day (BID) | ORAL | 3 refills | Status: DC

## 2023-11-29 ENCOUNTER — Ambulatory Visit (HOSPITAL_COMMUNITY)
Admission: RE | Admit: 2023-11-29 | Discharge: 2023-11-29 | Disposition: A | Discharge status: Home / Self Care | Source: Ambulatory Visit | Attending: Internal Medicine | Admitting: Internal Medicine

## 2023-11-29 DIAGNOSIS — C719 Malignant neoplasm of brain, unspecified: Secondary | ICD-10-CM | POA: Insufficient documentation

## 2023-11-29 MED ORDER — GADOBUTROL 1 MMOL/ML IV SOLN
6.0000 mL | Freq: Once | INTRAVENOUS | Status: AC | PRN
Start: 2023-11-29 — End: 2023-11-29
  Administered 2023-11-29: 6 mL via INTRAVENOUS

## 2023-12-01 ENCOUNTER — Inpatient Hospital Stay: Attending: Radiation Oncology | Admitting: Internal Medicine

## 2023-12-01 VITALS — BP 123/81 | HR 92 | Temp 98.3°F | Resp 17 | Ht 68.0 in | Wt 147.0 lb

## 2023-12-01 DIAGNOSIS — R569 Unspecified convulsions: Secondary | ICD-10-CM | POA: Insufficient documentation

## 2023-12-01 DIAGNOSIS — Z803 Family history of malignant neoplasm of breast: Secondary | ICD-10-CM | POA: Diagnosis not present

## 2023-12-01 DIAGNOSIS — Z923 Personal history of irradiation: Secondary | ICD-10-CM | POA: Diagnosis not present

## 2023-12-01 DIAGNOSIS — C711 Malignant neoplasm of frontal lobe: Secondary | ICD-10-CM | POA: Diagnosis present

## 2023-12-01 DIAGNOSIS — C719 Malignant neoplasm of brain, unspecified: Secondary | ICD-10-CM

## 2023-12-01 DIAGNOSIS — Z9221 Personal history of antineoplastic chemotherapy: Secondary | ICD-10-CM | POA: Diagnosis not present

## 2023-12-01 DIAGNOSIS — Z79899 Other long term (current) drug therapy: Secondary | ICD-10-CM | POA: Diagnosis not present
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
## 2023-12-05 ENCOUNTER — Telehealth: Payer: Self-pay | Admitting: Internal Medicine

## 2023-12-05 NOTE — Telephone Encounter (Signed)
 Scheduled patient for next appointment. Called and spoke with the patient, she is aware.

## 2023-12-06 ENCOUNTER — Other Ambulatory Visit: Payer: Self-pay

## 2024-01-01 ENCOUNTER — Encounter: Payer: Self-pay | Admitting: Internal Medicine

## 2024-01-01 ENCOUNTER — Other Ambulatory Visit: Payer: Self-pay | Admitting: Internal Medicine

## 2024-01-01 DIAGNOSIS — R569 Unspecified convulsions: Secondary | ICD-10-CM

## 2024-01-02 ENCOUNTER — Encounter: Payer: Self-pay | Admitting: Internal Medicine

## 2024-01-17 ENCOUNTER — Other Ambulatory Visit: Payer: Self-pay | Admitting: Internal Medicine

## 2024-01-17 DIAGNOSIS — R569 Unspecified convulsions: Secondary | ICD-10-CM

## 2024-01-20 ENCOUNTER — Encounter: Payer: Self-pay | Admitting: Internal Medicine

## 2024-01-20 ENCOUNTER — Other Ambulatory Visit: Payer: Self-pay | Admitting: Physician Assistant

## 2024-01-26 ENCOUNTER — Encounter: Payer: Self-pay | Admitting: Internal Medicine

## 2024-02-16 ENCOUNTER — Other Ambulatory Visit: Payer: Self-pay | Admitting: Internal Medicine

## 2024-02-16 DIAGNOSIS — R569 Unspecified convulsions: Secondary | ICD-10-CM

## 2024-02-27 ENCOUNTER — Encounter: Payer: Self-pay | Admitting: Internal Medicine

## 2024-03-01 ENCOUNTER — Ambulatory Visit (HOSPITAL_COMMUNITY)
Admission: RE | Admit: 2024-03-01 | Discharge: 2024-03-01 | Disposition: A | Discharge status: Home / Self Care | Source: Ambulatory Visit | Attending: Internal Medicine | Admitting: Internal Medicine

## 2024-03-01 DIAGNOSIS — C719 Malignant neoplasm of brain, unspecified: Secondary | ICD-10-CM | POA: Insufficient documentation

## 2024-03-01 DIAGNOSIS — C711 Malignant neoplasm of frontal lobe: Secondary | ICD-10-CM

## 2024-03-01 MED ORDER — GADOBUTROL 1 MMOL/ML IV SOLN
6.5000 mL | Freq: Once | INTRAVENOUS | Status: AC | PRN
  Administered 2024-03-01: 6.5 mL via INTRAVENOUS

## 2024-03-06 ENCOUNTER — Telehealth: Payer: Self-pay | Admitting: *Deleted

## 2024-03-06 ENCOUNTER — Inpatient Hospital Stay: Admitting: Internal Medicine

## 2024-03-06 NOTE — Telephone Encounter (Signed)
 PC to patient regarding missed appointment today, she thought it was on Thursday.  Appointment rescheduled on 03/12/24 at 9:30, patient verbalizes understanding.

## 2024-03-07 ENCOUNTER — Other Ambulatory Visit: Payer: Self-pay

## 2024-03-12 ENCOUNTER — Inpatient Hospital Stay: Admitting: Internal Medicine
